# Patient Record
Sex: Male | Born: 1950 | Race: Black or African American | Hispanic: No | Smoking: Current every day smoker
Health system: Southern US, Community
[De-identification: ages and names within clinical notes are randomized; demographics above are authoritative.]

## PROBLEM LIST (undated history)

## (undated) DIAGNOSIS — F17200 Nicotine dependence, unspecified, uncomplicated: Secondary | ICD-10-CM

## (undated) DIAGNOSIS — S8990XA Unspecified injury of unspecified lower leg, initial encounter: Secondary | ICD-10-CM

## (undated) DIAGNOSIS — C189 Malignant neoplasm of colon, unspecified: Secondary | ICD-10-CM

## (undated) DIAGNOSIS — I2699 Other pulmonary embolism without acute cor pulmonale: Secondary | ICD-10-CM

## (undated) DIAGNOSIS — D509 Iron deficiency anemia, unspecified: Secondary | ICD-10-CM

## (undated) DIAGNOSIS — K219 Gastro-esophageal reflux disease without esophagitis: Secondary | ICD-10-CM

## (undated) HISTORY — PX: FRACTURE SURGERY: SHX138

## (undated) HISTORY — DX: Unspecified injury of unspecified lower leg, initial encounter: S89.90XA

---

## 2016-12-20 DIAGNOSIS — Y33XXXA Other specified events, undetermined intent, initial encounter: Secondary | ICD-10-CM | POA: Diagnosis not present

## 2016-12-20 DIAGNOSIS — S82891C Other fracture of right lower leg, initial encounter for open fracture type IIIA, IIIB, or IIIC: Secondary | ICD-10-CM | POA: Diagnosis not present

## 2016-12-20 DIAGNOSIS — Z043 Encounter for examination and observation following other accident: Secondary | ICD-10-CM | POA: Diagnosis not present

## 2016-12-20 DIAGNOSIS — M25571 Pain in right ankle and joints of right foot: Secondary | ICD-10-CM | POA: Diagnosis not present

## 2016-12-20 DIAGNOSIS — S3991XA Unspecified injury of abdomen, initial encounter: Secondary | ICD-10-CM | POA: Diagnosis not present

## 2016-12-20 DIAGNOSIS — S299XXA Unspecified injury of thorax, initial encounter: Secondary | ICD-10-CM | POA: Diagnosis not present

## 2016-12-20 DIAGNOSIS — S9304XA Dislocation of right ankle joint, initial encounter: Secondary | ICD-10-CM | POA: Diagnosis not present

## 2016-12-20 DIAGNOSIS — S8251XA Displaced fracture of medial malleolus of right tibia, initial encounter for closed fracture: Secondary | ICD-10-CM | POA: Diagnosis not present

## 2016-12-20 DIAGNOSIS — R911 Solitary pulmonary nodule: Secondary | ICD-10-CM | POA: Diagnosis not present

## 2016-12-20 DIAGNOSIS — S199XXA Unspecified injury of neck, initial encounter: Secondary | ICD-10-CM | POA: Diagnosis not present

## 2016-12-20 DIAGNOSIS — M4802 Spinal stenosis, cervical region: Secondary | ICD-10-CM | POA: Diagnosis not present

## 2016-12-20 DIAGNOSIS — M50222 Other cervical disc displacement at C5-C6 level: Secondary | ICD-10-CM | POA: Diagnosis not present

## 2016-12-20 DIAGNOSIS — S82851C Displaced trimalleolar fracture of right lower leg, initial encounter for open fracture type IIIA, IIIB, or IIIC: Secondary | ICD-10-CM | POA: Diagnosis not present

## 2016-12-20 DIAGNOSIS — S3992XA Unspecified injury of lower back, initial encounter: Secondary | ICD-10-CM | POA: Diagnosis not present

## 2016-12-20 DIAGNOSIS — S3993XA Unspecified injury of pelvis, initial encounter: Secondary | ICD-10-CM | POA: Diagnosis not present

## 2016-12-20 DIAGNOSIS — S8251XB Displaced fracture of medial malleolus of right tibia, initial encounter for open fracture type I or II: Secondary | ICD-10-CM | POA: Diagnosis not present

## 2016-12-20 DIAGNOSIS — S0990XA Unspecified injury of head, initial encounter: Secondary | ICD-10-CM | POA: Diagnosis not present

## 2016-12-20 DIAGNOSIS — S82831A Other fracture of upper and lower end of right fibula, initial encounter for closed fracture: Secondary | ICD-10-CM | POA: Diagnosis not present

## 2016-12-20 DIAGNOSIS — R5381 Other malaise: Secondary | ICD-10-CM | POA: Diagnosis not present

## 2016-12-20 DIAGNOSIS — W19XXXA Unspecified fall, initial encounter: Secondary | ICD-10-CM | POA: Diagnosis not present

## 2016-12-20 DIAGNOSIS — E871 Hypo-osmolality and hyponatremia: Secondary | ICD-10-CM | POA: Diagnosis not present

## 2016-12-20 DIAGNOSIS — F17210 Nicotine dependence, cigarettes, uncomplicated: Secondary | ICD-10-CM | POA: Diagnosis not present

## 2016-12-20 DIAGNOSIS — S99911A Unspecified injury of right ankle, initial encounter: Secondary | ICD-10-CM | POA: Diagnosis not present

## 2016-12-20 DIAGNOSIS — W11XXXA Fall on and from ladder, initial encounter: Secondary | ICD-10-CM | POA: Diagnosis not present

## 2016-12-20 DIAGNOSIS — M8580 Other specified disorders of bone density and structure, unspecified site: Secondary | ICD-10-CM | POA: Diagnosis not present

## 2016-12-21 DIAGNOSIS — E871 Hypo-osmolality and hyponatremia: Secondary | ICD-10-CM | POA: Diagnosis not present

## 2016-12-21 DIAGNOSIS — W11XXXA Fall on and from ladder, initial encounter: Secondary | ICD-10-CM | POA: Diagnosis not present

## 2016-12-21 DIAGNOSIS — R52 Pain, unspecified: Secondary | ICD-10-CM | POA: Diagnosis not present

## 2016-12-21 DIAGNOSIS — W19XXXA Unspecified fall, initial encounter: Secondary | ICD-10-CM | POA: Diagnosis not present

## 2016-12-21 DIAGNOSIS — S82851C Displaced trimalleolar fracture of right lower leg, initial encounter for open fracture type IIIA, IIIB, or IIIC: Secondary | ICD-10-CM | POA: Diagnosis present

## 2016-12-21 DIAGNOSIS — I517 Cardiomegaly: Secondary | ICD-10-CM | POA: Diagnosis not present

## 2016-12-21 DIAGNOSIS — R531 Weakness: Secondary | ICD-10-CM | POA: Diagnosis not present

## 2016-12-21 DIAGNOSIS — S82841A Displaced bimalleolar fracture of right lower leg, initial encounter for closed fracture: Secondary | ICD-10-CM | POA: Diagnosis not present

## 2016-12-21 DIAGNOSIS — K5900 Constipation, unspecified: Secondary | ICD-10-CM | POA: Diagnosis not present

## 2016-12-21 DIAGNOSIS — Z5189 Encounter for other specified aftercare: Secondary | ICD-10-CM | POA: Diagnosis not present

## 2016-12-21 DIAGNOSIS — S8291XD Unspecified fracture of right lower leg, subsequent encounter for closed fracture with routine healing: Secondary | ICD-10-CM | POA: Diagnosis not present

## 2016-12-21 DIAGNOSIS — S82891C Other fracture of right lower leg, initial encounter for open fracture type IIIA, IIIB, or IIIC: Secondary | ICD-10-CM | POA: Diagnosis not present

## 2016-12-21 DIAGNOSIS — R262 Difficulty in walking, not elsewhere classified: Secondary | ICD-10-CM | POA: Diagnosis not present

## 2016-12-21 DIAGNOSIS — F17210 Nicotine dependence, cigarettes, uncomplicated: Secondary | ICD-10-CM | POA: Diagnosis not present

## 2016-12-21 DIAGNOSIS — Z23 Encounter for immunization: Secondary | ICD-10-CM | POA: Diagnosis not present

## 2016-12-21 DIAGNOSIS — Z7401 Bed confinement status: Secondary | ICD-10-CM | POA: Diagnosis not present

## 2016-12-21 DIAGNOSIS — M6281 Muscle weakness (generalized): Secondary | ICD-10-CM | POA: Diagnosis not present

## 2016-12-21 DIAGNOSIS — S82451A Displaced comminuted fracture of shaft of right fibula, initial encounter for closed fracture: Secondary | ICD-10-CM | POA: Diagnosis not present

## 2016-12-26 DIAGNOSIS — S82891F Other fracture of right lower leg, subsequent encounter for open fracture type IIIA, IIIB, or IIIC with routine healing: Secondary | ICD-10-CM | POA: Diagnosis not present

## 2016-12-26 DIAGNOSIS — R262 Difficulty in walking, not elsewhere classified: Secondary | ICD-10-CM | POA: Diagnosis not present

## 2016-12-26 DIAGNOSIS — Y33XXXD Other specified events, undetermined intent, subsequent encounter: Secondary | ICD-10-CM | POA: Diagnosis not present

## 2016-12-26 DIAGNOSIS — Z7982 Long term (current) use of aspirin: Secondary | ICD-10-CM | POA: Diagnosis not present

## 2016-12-26 DIAGNOSIS — F17210 Nicotine dependence, cigarettes, uncomplicated: Secondary | ICD-10-CM | POA: Diagnosis not present

## 2016-12-26 DIAGNOSIS — D649 Anemia, unspecified: Secondary | ICD-10-CM | POA: Diagnosis not present

## 2016-12-26 DIAGNOSIS — K5900 Constipation, unspecified: Secondary | ICD-10-CM | POA: Diagnosis not present

## 2016-12-26 DIAGNOSIS — R531 Weakness: Secondary | ICD-10-CM | POA: Diagnosis not present

## 2016-12-26 DIAGNOSIS — F17200 Nicotine dependence, unspecified, uncomplicated: Secondary | ICD-10-CM | POA: Diagnosis not present

## 2016-12-26 DIAGNOSIS — Z791 Long term (current) use of non-steroidal anti-inflammatories (NSAID): Secondary | ICD-10-CM | POA: Diagnosis not present

## 2016-12-26 DIAGNOSIS — S82841D Displaced bimalleolar fracture of right lower leg, subsequent encounter for closed fracture with routine healing: Secondary | ICD-10-CM | POA: Diagnosis not present

## 2016-12-26 DIAGNOSIS — Z23 Encounter for immunization: Secondary | ICD-10-CM | POA: Diagnosis not present

## 2016-12-26 DIAGNOSIS — S82851C Displaced trimalleolar fracture of right lower leg, initial encounter for open fracture type IIIA, IIIB, or IIIC: Secondary | ICD-10-CM | POA: Diagnosis not present

## 2016-12-26 DIAGNOSIS — S82851D Displaced trimalleolar fracture of right lower leg, subsequent encounter for closed fracture with routine healing: Secondary | ICD-10-CM | POA: Diagnosis not present

## 2016-12-26 DIAGNOSIS — R52 Pain, unspecified: Secondary | ICD-10-CM | POA: Diagnosis not present

## 2016-12-26 DIAGNOSIS — Z5189 Encounter for other specified aftercare: Secondary | ICD-10-CM | POA: Diagnosis not present

## 2016-12-26 DIAGNOSIS — Z7401 Bed confinement status: Secondary | ICD-10-CM | POA: Diagnosis not present

## 2016-12-26 DIAGNOSIS — S8291XD Unspecified fracture of right lower leg, subsequent encounter for closed fracture with routine healing: Secondary | ICD-10-CM | POA: Diagnosis not present

## 2016-12-26 DIAGNOSIS — M6281 Muscle weakness (generalized): Secondary | ICD-10-CM | POA: Diagnosis not present

## 2016-12-26 DIAGNOSIS — E871 Hypo-osmolality and hyponatremia: Secondary | ICD-10-CM | POA: Diagnosis not present

## 2016-12-30 DIAGNOSIS — M6281 Muscle weakness (generalized): Secondary | ICD-10-CM | POA: Diagnosis not present

## 2016-12-30 DIAGNOSIS — Z791 Long term (current) use of non-steroidal anti-inflammatories (NSAID): Secondary | ICD-10-CM | POA: Diagnosis not present

## 2016-12-30 DIAGNOSIS — S82851D Displaced trimalleolar fracture of right lower leg, subsequent encounter for closed fracture with routine healing: Secondary | ICD-10-CM | POA: Diagnosis not present

## 2017-01-01 DIAGNOSIS — S82841D Displaced bimalleolar fracture of right lower leg, subsequent encounter for closed fracture with routine healing: Secondary | ICD-10-CM | POA: Diagnosis not present

## 2017-01-01 DIAGNOSIS — F17200 Nicotine dependence, unspecified, uncomplicated: Secondary | ICD-10-CM | POA: Diagnosis not present

## 2017-01-01 DIAGNOSIS — S82891F Other fracture of right lower leg, subsequent encounter for open fracture type IIIA, IIIB, or IIIC with routine healing: Secondary | ICD-10-CM | POA: Diagnosis not present

## 2017-01-01 DIAGNOSIS — S82851D Displaced trimalleolar fracture of right lower leg, subsequent encounter for closed fracture with routine healing: Secondary | ICD-10-CM | POA: Diagnosis not present

## 2017-01-01 DIAGNOSIS — Y33XXXD Other specified events, undetermined intent, subsequent encounter: Secondary | ICD-10-CM | POA: Diagnosis not present

## 2017-01-08 DIAGNOSIS — F17210 Nicotine dependence, cigarettes, uncomplicated: Secondary | ICD-10-CM | POA: Diagnosis not present

## 2017-01-08 DIAGNOSIS — S82851D Displaced trimalleolar fracture of right lower leg, subsequent encounter for closed fracture with routine healing: Secondary | ICD-10-CM | POA: Diagnosis not present

## 2017-01-08 DIAGNOSIS — D649 Anemia, unspecified: Secondary | ICD-10-CM | POA: Diagnosis not present

## 2017-01-08 DIAGNOSIS — Z7982 Long term (current) use of aspirin: Secondary | ICD-10-CM | POA: Diagnosis not present

## 2017-01-11 DIAGNOSIS — S82851E Displaced trimalleolar fracture of right lower leg, subsequent encounter for open fracture type I or II with routine healing: Secondary | ICD-10-CM | POA: Diagnosis not present

## 2017-01-11 DIAGNOSIS — Z9181 History of falling: Secondary | ICD-10-CM | POA: Diagnosis not present

## 2017-01-11 DIAGNOSIS — K5900 Constipation, unspecified: Secondary | ICD-10-CM | POA: Diagnosis not present

## 2017-01-11 DIAGNOSIS — F17210 Nicotine dependence, cigarettes, uncomplicated: Secondary | ICD-10-CM | POA: Diagnosis not present

## 2017-01-14 DIAGNOSIS — K5900 Constipation, unspecified: Secondary | ICD-10-CM | POA: Diagnosis not present

## 2017-01-14 DIAGNOSIS — Z9181 History of falling: Secondary | ICD-10-CM | POA: Diagnosis not present

## 2017-01-14 DIAGNOSIS — S82851E Displaced trimalleolar fracture of right lower leg, subsequent encounter for open fracture type I or II with routine healing: Secondary | ICD-10-CM | POA: Diagnosis not present

## 2017-01-14 DIAGNOSIS — F17210 Nicotine dependence, cigarettes, uncomplicated: Secondary | ICD-10-CM | POA: Diagnosis not present

## 2017-01-15 DIAGNOSIS — S8253XA Displaced fracture of medial malleolus of unspecified tibia, initial encounter for closed fracture: Secondary | ICD-10-CM | POA: Diagnosis not present

## 2017-01-15 DIAGNOSIS — Y33XXXA Other specified events, undetermined intent, initial encounter: Secondary | ICD-10-CM | POA: Diagnosis not present

## 2017-01-15 DIAGNOSIS — S82851D Displaced trimalleolar fracture of right lower leg, subsequent encounter for closed fracture with routine healing: Secondary | ICD-10-CM | POA: Diagnosis not present

## 2017-01-16 DIAGNOSIS — Z9181 History of falling: Secondary | ICD-10-CM | POA: Diagnosis not present

## 2017-01-16 DIAGNOSIS — F17210 Nicotine dependence, cigarettes, uncomplicated: Secondary | ICD-10-CM | POA: Diagnosis not present

## 2017-01-16 DIAGNOSIS — S82851E Displaced trimalleolar fracture of right lower leg, subsequent encounter for open fracture type I or II with routine healing: Secondary | ICD-10-CM | POA: Diagnosis not present

## 2017-01-16 DIAGNOSIS — K5900 Constipation, unspecified: Secondary | ICD-10-CM | POA: Diagnosis not present

## 2017-01-18 DIAGNOSIS — F17210 Nicotine dependence, cigarettes, uncomplicated: Secondary | ICD-10-CM | POA: Diagnosis not present

## 2017-01-18 DIAGNOSIS — K5900 Constipation, unspecified: Secondary | ICD-10-CM | POA: Diagnosis not present

## 2017-01-18 DIAGNOSIS — S82851E Displaced trimalleolar fracture of right lower leg, subsequent encounter for open fracture type I or II with routine healing: Secondary | ICD-10-CM | POA: Diagnosis not present

## 2017-01-18 DIAGNOSIS — Z9181 History of falling: Secondary | ICD-10-CM | POA: Diagnosis not present

## 2017-01-23 DIAGNOSIS — M869 Osteomyelitis, unspecified: Secondary | ICD-10-CM | POA: Diagnosis not present

## 2017-01-23 DIAGNOSIS — Z452 Encounter for adjustment and management of vascular access device: Secondary | ICD-10-CM | POA: Diagnosis not present

## 2017-01-23 DIAGNOSIS — G8918 Other acute postprocedural pain: Secondary | ICD-10-CM | POA: Diagnosis not present

## 2017-01-23 DIAGNOSIS — S82841D Displaced bimalleolar fracture of right lower leg, subsequent encounter for closed fracture with routine healing: Secondary | ICD-10-CM | POA: Diagnosis not present

## 2017-01-23 DIAGNOSIS — S82851C Displaced trimalleolar fracture of right lower leg, initial encounter for open fracture type IIIA, IIIB, or IIIC: Secondary | ICD-10-CM | POA: Diagnosis not present

## 2017-01-23 DIAGNOSIS — T847XXA Infection and inflammatory reaction due to other internal orthopedic prosthetic devices, implants and grafts, initial encounter: Secondary | ICD-10-CM | POA: Diagnosis not present

## 2017-01-23 DIAGNOSIS — F17290 Nicotine dependence, other tobacco product, uncomplicated: Secondary | ICD-10-CM | POA: Diagnosis present

## 2017-01-23 DIAGNOSIS — A4901 Methicillin susceptible Staphylococcus aureus infection, unspecified site: Secondary | ICD-10-CM | POA: Diagnosis not present

## 2017-01-23 DIAGNOSIS — S82851F Displaced trimalleolar fracture of right lower leg, subsequent encounter for open fracture type IIIA, IIIB, or IIIC with routine healing: Secondary | ICD-10-CM | POA: Diagnosis not present

## 2017-01-23 DIAGNOSIS — T847XXD Infection and inflammatory reaction due to other internal orthopedic prosthetic devices, implants and grafts, subsequent encounter: Secondary | ICD-10-CM | POA: Diagnosis not present

## 2017-01-23 DIAGNOSIS — Y33XXXA Other specified events, undetermined intent, initial encounter: Secondary | ICD-10-CM | POA: Diagnosis not present

## 2017-01-23 DIAGNOSIS — Y33XXXD Other specified events, undetermined intent, subsequent encounter: Secondary | ICD-10-CM | POA: Diagnosis not present

## 2017-01-23 DIAGNOSIS — F17210 Nicotine dependence, cigarettes, uncomplicated: Secondary | ICD-10-CM | POA: Diagnosis present

## 2017-01-23 DIAGNOSIS — B964 Proteus (mirabilis) (morganii) as the cause of diseases classified elsewhere: Secondary | ICD-10-CM | POA: Diagnosis present

## 2017-01-23 DIAGNOSIS — T8141XA Infection following a procedure, superficial incisional surgical site, initial encounter: Secondary | ICD-10-CM | POA: Diagnosis present

## 2017-01-23 DIAGNOSIS — A498 Other bacterial infections of unspecified site: Secondary | ICD-10-CM | POA: Diagnosis not present

## 2017-01-31 DIAGNOSIS — K5900 Constipation, unspecified: Secondary | ICD-10-CM | POA: Diagnosis not present

## 2017-01-31 DIAGNOSIS — Z9181 History of falling: Secondary | ICD-10-CM | POA: Diagnosis not present

## 2017-01-31 DIAGNOSIS — S82851E Displaced trimalleolar fracture of right lower leg, subsequent encounter for open fracture type I or II with routine healing: Secondary | ICD-10-CM | POA: Diagnosis not present

## 2017-01-31 DIAGNOSIS — F17210 Nicotine dependence, cigarettes, uncomplicated: Secondary | ICD-10-CM | POA: Diagnosis not present

## 2017-02-01 DIAGNOSIS — F17210 Nicotine dependence, cigarettes, uncomplicated: Secondary | ICD-10-CM | POA: Diagnosis not present

## 2017-02-01 DIAGNOSIS — Z9181 History of falling: Secondary | ICD-10-CM | POA: Diagnosis not present

## 2017-02-01 DIAGNOSIS — K5900 Constipation, unspecified: Secondary | ICD-10-CM | POA: Diagnosis not present

## 2017-02-01 DIAGNOSIS — S82851E Displaced trimalleolar fracture of right lower leg, subsequent encounter for open fracture type I or II with routine healing: Secondary | ICD-10-CM | POA: Diagnosis not present

## 2017-02-03 DIAGNOSIS — K5900 Constipation, unspecified: Secondary | ICD-10-CM | POA: Diagnosis not present

## 2017-02-03 DIAGNOSIS — F17210 Nicotine dependence, cigarettes, uncomplicated: Secondary | ICD-10-CM | POA: Diagnosis not present

## 2017-02-03 DIAGNOSIS — S82851E Displaced trimalleolar fracture of right lower leg, subsequent encounter for open fracture type I or II with routine healing: Secondary | ICD-10-CM | POA: Diagnosis not present

## 2017-02-03 DIAGNOSIS — Z9181 History of falling: Secondary | ICD-10-CM | POA: Diagnosis not present

## 2017-02-04 DIAGNOSIS — Z9181 History of falling: Secondary | ICD-10-CM | POA: Diagnosis not present

## 2017-02-04 DIAGNOSIS — F17210 Nicotine dependence, cigarettes, uncomplicated: Secondary | ICD-10-CM | POA: Diagnosis not present

## 2017-02-04 DIAGNOSIS — T847XXA Infection and inflammatory reaction due to other internal orthopedic prosthetic devices, implants and grafts, initial encounter: Secondary | ICD-10-CM | POA: Diagnosis not present

## 2017-02-04 DIAGNOSIS — S82851E Displaced trimalleolar fracture of right lower leg, subsequent encounter for open fracture type I or II with routine healing: Secondary | ICD-10-CM | POA: Diagnosis not present

## 2017-02-04 DIAGNOSIS — K5900 Constipation, unspecified: Secondary | ICD-10-CM | POA: Diagnosis not present

## 2017-02-06 DIAGNOSIS — Z9181 History of falling: Secondary | ICD-10-CM | POA: Diagnosis not present

## 2017-02-06 DIAGNOSIS — F17210 Nicotine dependence, cigarettes, uncomplicated: Secondary | ICD-10-CM | POA: Diagnosis not present

## 2017-02-06 DIAGNOSIS — S82851E Displaced trimalleolar fracture of right lower leg, subsequent encounter for open fracture type I or II with routine healing: Secondary | ICD-10-CM | POA: Diagnosis not present

## 2017-02-06 DIAGNOSIS — K5900 Constipation, unspecified: Secondary | ICD-10-CM | POA: Diagnosis not present

## 2017-02-07 DIAGNOSIS — Z9181 History of falling: Secondary | ICD-10-CM | POA: Diagnosis not present

## 2017-02-07 DIAGNOSIS — K5900 Constipation, unspecified: Secondary | ICD-10-CM | POA: Diagnosis not present

## 2017-02-07 DIAGNOSIS — S82851E Displaced trimalleolar fracture of right lower leg, subsequent encounter for open fracture type I or II with routine healing: Secondary | ICD-10-CM | POA: Diagnosis not present

## 2017-02-07 DIAGNOSIS — F17210 Nicotine dependence, cigarettes, uncomplicated: Secondary | ICD-10-CM | POA: Diagnosis not present

## 2017-02-10 DIAGNOSIS — F17210 Nicotine dependence, cigarettes, uncomplicated: Secondary | ICD-10-CM | POA: Diagnosis not present

## 2017-02-10 DIAGNOSIS — K5900 Constipation, unspecified: Secondary | ICD-10-CM | POA: Diagnosis not present

## 2017-02-10 DIAGNOSIS — S82851E Displaced trimalleolar fracture of right lower leg, subsequent encounter for open fracture type I or II with routine healing: Secondary | ICD-10-CM | POA: Diagnosis not present

## 2017-02-10 DIAGNOSIS — Z9181 History of falling: Secondary | ICD-10-CM | POA: Diagnosis not present

## 2017-02-11 DIAGNOSIS — S82851E Displaced trimalleolar fracture of right lower leg, subsequent encounter for open fracture type I or II with routine healing: Secondary | ICD-10-CM | POA: Diagnosis not present

## 2017-02-11 DIAGNOSIS — F17210 Nicotine dependence, cigarettes, uncomplicated: Secondary | ICD-10-CM | POA: Diagnosis not present

## 2017-02-11 DIAGNOSIS — K5900 Constipation, unspecified: Secondary | ICD-10-CM | POA: Diagnosis not present

## 2017-02-11 DIAGNOSIS — Z9181 History of falling: Secondary | ICD-10-CM | POA: Diagnosis not present

## 2017-02-11 DIAGNOSIS — T847XXA Infection and inflammatory reaction due to other internal orthopedic prosthetic devices, implants and grafts, initial encounter: Secondary | ICD-10-CM | POA: Diagnosis not present

## 2017-02-12 DIAGNOSIS — M85871 Other specified disorders of bone density and structure, right ankle and foot: Secondary | ICD-10-CM | POA: Diagnosis not present

## 2017-02-12 DIAGNOSIS — M8580 Other specified disorders of bone density and structure, unspecified site: Secondary | ICD-10-CM | POA: Diagnosis not present

## 2017-02-12 DIAGNOSIS — S82851A Displaced trimalleolar fracture of right lower leg, initial encounter for closed fracture: Secondary | ICD-10-CM | POA: Diagnosis not present

## 2017-02-12 DIAGNOSIS — S82851F Displaced trimalleolar fracture of right lower leg, subsequent encounter for open fracture type IIIA, IIIB, or IIIC with routine healing: Secondary | ICD-10-CM | POA: Diagnosis not present

## 2017-02-12 DIAGNOSIS — Y33XXXA Other specified events, undetermined intent, initial encounter: Secondary | ICD-10-CM | POA: Diagnosis not present

## 2017-02-12 DIAGNOSIS — R2241 Localized swelling, mass and lump, right lower limb: Secondary | ICD-10-CM | POA: Diagnosis not present

## 2017-02-12 DIAGNOSIS — F17210 Nicotine dependence, cigarettes, uncomplicated: Secondary | ICD-10-CM | POA: Diagnosis not present

## 2017-02-12 DIAGNOSIS — K5900 Constipation, unspecified: Secondary | ICD-10-CM | POA: Diagnosis not present

## 2017-02-12 DIAGNOSIS — Z9181 History of falling: Secondary | ICD-10-CM | POA: Diagnosis not present

## 2017-02-12 DIAGNOSIS — S82851E Displaced trimalleolar fracture of right lower leg, subsequent encounter for open fracture type I or II with routine healing: Secondary | ICD-10-CM | POA: Diagnosis not present

## 2017-02-14 DIAGNOSIS — S82851E Displaced trimalleolar fracture of right lower leg, subsequent encounter for open fracture type I or II with routine healing: Secondary | ICD-10-CM | POA: Diagnosis not present

## 2017-02-14 DIAGNOSIS — Z9181 History of falling: Secondary | ICD-10-CM | POA: Diagnosis not present

## 2017-02-14 DIAGNOSIS — K5900 Constipation, unspecified: Secondary | ICD-10-CM | POA: Diagnosis not present

## 2017-02-14 DIAGNOSIS — F17210 Nicotine dependence, cigarettes, uncomplicated: Secondary | ICD-10-CM | POA: Diagnosis not present

## 2017-02-15 DIAGNOSIS — S82851E Displaced trimalleolar fracture of right lower leg, subsequent encounter for open fracture type I or II with routine healing: Secondary | ICD-10-CM | POA: Diagnosis not present

## 2017-02-15 DIAGNOSIS — K5900 Constipation, unspecified: Secondary | ICD-10-CM | POA: Diagnosis not present

## 2017-02-15 DIAGNOSIS — F17210 Nicotine dependence, cigarettes, uncomplicated: Secondary | ICD-10-CM | POA: Diagnosis not present

## 2017-02-15 DIAGNOSIS — Z9181 History of falling: Secondary | ICD-10-CM | POA: Diagnosis not present

## 2017-02-18 DIAGNOSIS — K5900 Constipation, unspecified: Secondary | ICD-10-CM | POA: Diagnosis not present

## 2017-02-18 DIAGNOSIS — S82851E Displaced trimalleolar fracture of right lower leg, subsequent encounter for open fracture type I or II with routine healing: Secondary | ICD-10-CM | POA: Diagnosis not present

## 2017-02-18 DIAGNOSIS — Z9181 History of falling: Secondary | ICD-10-CM | POA: Diagnosis not present

## 2017-02-18 DIAGNOSIS — F17210 Nicotine dependence, cigarettes, uncomplicated: Secondary | ICD-10-CM | POA: Diagnosis not present

## 2017-02-19 DIAGNOSIS — K5900 Constipation, unspecified: Secondary | ICD-10-CM | POA: Diagnosis not present

## 2017-02-19 DIAGNOSIS — Z9181 History of falling: Secondary | ICD-10-CM | POA: Diagnosis not present

## 2017-02-19 DIAGNOSIS — T847XXA Infection and inflammatory reaction due to other internal orthopedic prosthetic devices, implants and grafts, initial encounter: Secondary | ICD-10-CM | POA: Diagnosis not present

## 2017-02-19 DIAGNOSIS — F17210 Nicotine dependence, cigarettes, uncomplicated: Secondary | ICD-10-CM | POA: Diagnosis not present

## 2017-02-19 DIAGNOSIS — S82851E Displaced trimalleolar fracture of right lower leg, subsequent encounter for open fracture type I or II with routine healing: Secondary | ICD-10-CM | POA: Diagnosis not present

## 2017-02-20 DIAGNOSIS — K5900 Constipation, unspecified: Secondary | ICD-10-CM | POA: Diagnosis not present

## 2017-02-20 DIAGNOSIS — S82851E Displaced trimalleolar fracture of right lower leg, subsequent encounter for open fracture type I or II with routine healing: Secondary | ICD-10-CM | POA: Diagnosis not present

## 2017-02-20 DIAGNOSIS — F17210 Nicotine dependence, cigarettes, uncomplicated: Secondary | ICD-10-CM | POA: Diagnosis not present

## 2017-02-20 DIAGNOSIS — Z9181 History of falling: Secondary | ICD-10-CM | POA: Diagnosis not present

## 2017-02-21 DIAGNOSIS — Z9181 History of falling: Secondary | ICD-10-CM | POA: Diagnosis not present

## 2017-02-21 DIAGNOSIS — F17210 Nicotine dependence, cigarettes, uncomplicated: Secondary | ICD-10-CM | POA: Diagnosis not present

## 2017-02-21 DIAGNOSIS — K5900 Constipation, unspecified: Secondary | ICD-10-CM | POA: Diagnosis not present

## 2017-02-21 DIAGNOSIS — S82851E Displaced trimalleolar fracture of right lower leg, subsequent encounter for open fracture type I or II with routine healing: Secondary | ICD-10-CM | POA: Diagnosis not present

## 2017-02-23 DIAGNOSIS — K5900 Constipation, unspecified: Secondary | ICD-10-CM | POA: Diagnosis not present

## 2017-02-23 DIAGNOSIS — S82851E Displaced trimalleolar fracture of right lower leg, subsequent encounter for open fracture type I or II with routine healing: Secondary | ICD-10-CM | POA: Diagnosis not present

## 2017-02-23 DIAGNOSIS — T8469XA Infection and inflammatory reaction due to internal fixation device of other site, initial encounter: Secondary | ICD-10-CM | POA: Diagnosis not present

## 2017-02-23 DIAGNOSIS — Z9181 History of falling: Secondary | ICD-10-CM | POA: Diagnosis not present

## 2017-02-23 DIAGNOSIS — B9561 Methicillin susceptible Staphylococcus aureus infection as the cause of diseases classified elsewhere: Secondary | ICD-10-CM | POA: Diagnosis not present

## 2017-02-23 DIAGNOSIS — F17210 Nicotine dependence, cigarettes, uncomplicated: Secondary | ICD-10-CM | POA: Diagnosis not present

## 2017-02-26 DIAGNOSIS — Z9181 History of falling: Secondary | ICD-10-CM | POA: Diagnosis not present

## 2017-02-26 DIAGNOSIS — K5900 Constipation, unspecified: Secondary | ICD-10-CM | POA: Diagnosis not present

## 2017-02-26 DIAGNOSIS — B9561 Methicillin susceptible Staphylococcus aureus infection as the cause of diseases classified elsewhere: Secondary | ICD-10-CM | POA: Diagnosis not present

## 2017-02-26 DIAGNOSIS — S82851E Displaced trimalleolar fracture of right lower leg, subsequent encounter for open fracture type I or II with routine healing: Secondary | ICD-10-CM | POA: Diagnosis not present

## 2017-02-26 DIAGNOSIS — T8469XA Infection and inflammatory reaction due to internal fixation device of other site, initial encounter: Secondary | ICD-10-CM | POA: Diagnosis not present

## 2017-02-26 DIAGNOSIS — F17210 Nicotine dependence, cigarettes, uncomplicated: Secondary | ICD-10-CM | POA: Diagnosis not present

## 2017-02-28 DIAGNOSIS — K5900 Constipation, unspecified: Secondary | ICD-10-CM | POA: Diagnosis not present

## 2017-02-28 DIAGNOSIS — F17210 Nicotine dependence, cigarettes, uncomplicated: Secondary | ICD-10-CM | POA: Diagnosis not present

## 2017-02-28 DIAGNOSIS — Z9181 History of falling: Secondary | ICD-10-CM | POA: Diagnosis not present

## 2017-02-28 DIAGNOSIS — S82851E Displaced trimalleolar fracture of right lower leg, subsequent encounter for open fracture type I or II with routine healing: Secondary | ICD-10-CM | POA: Diagnosis not present

## 2017-03-04 DIAGNOSIS — Z9181 History of falling: Secondary | ICD-10-CM | POA: Diagnosis not present

## 2017-03-04 DIAGNOSIS — S82851E Displaced trimalleolar fracture of right lower leg, subsequent encounter for open fracture type I or II with routine healing: Secondary | ICD-10-CM | POA: Diagnosis not present

## 2017-03-04 DIAGNOSIS — F17210 Nicotine dependence, cigarettes, uncomplicated: Secondary | ICD-10-CM | POA: Diagnosis not present

## 2017-03-04 DIAGNOSIS — K5900 Constipation, unspecified: Secondary | ICD-10-CM | POA: Diagnosis not present

## 2017-03-06 DIAGNOSIS — Z9181 History of falling: Secondary | ICD-10-CM | POA: Diagnosis not present

## 2017-03-06 DIAGNOSIS — S82851E Displaced trimalleolar fracture of right lower leg, subsequent encounter for open fracture type I or II with routine healing: Secondary | ICD-10-CM | POA: Diagnosis not present

## 2017-03-06 DIAGNOSIS — F17210 Nicotine dependence, cigarettes, uncomplicated: Secondary | ICD-10-CM | POA: Diagnosis not present

## 2017-03-06 DIAGNOSIS — K5900 Constipation, unspecified: Secondary | ICD-10-CM | POA: Diagnosis not present

## 2017-03-07 DIAGNOSIS — I82621 Acute embolism and thrombosis of deep veins of right upper extremity: Secondary | ICD-10-CM | POA: Diagnosis not present

## 2017-03-07 DIAGNOSIS — F17210 Nicotine dependence, cigarettes, uncomplicated: Secondary | ICD-10-CM | POA: Diagnosis not present

## 2017-03-07 DIAGNOSIS — K5900 Constipation, unspecified: Secondary | ICD-10-CM | POA: Diagnosis not present

## 2017-03-07 DIAGNOSIS — T82598A Other mechanical complication of other cardiac and vascular devices and implants, initial encounter: Secondary | ICD-10-CM | POA: Diagnosis not present

## 2017-03-07 DIAGNOSIS — Z5181 Encounter for therapeutic drug level monitoring: Secondary | ICD-10-CM | POA: Diagnosis not present

## 2017-03-07 DIAGNOSIS — M7989 Other specified soft tissue disorders: Secondary | ICD-10-CM | POA: Diagnosis not present

## 2017-03-07 DIAGNOSIS — Z79899 Other long term (current) drug therapy: Secondary | ICD-10-CM | POA: Diagnosis not present

## 2017-03-07 DIAGNOSIS — S82851E Displaced trimalleolar fracture of right lower leg, subsequent encounter for open fracture type I or II with routine healing: Secondary | ICD-10-CM | POA: Diagnosis not present

## 2017-03-07 DIAGNOSIS — Z452 Encounter for adjustment and management of vascular access device: Secondary | ICD-10-CM | POA: Diagnosis not present

## 2017-03-07 DIAGNOSIS — I82B11 Acute embolism and thrombosis of right subclavian vein: Secondary | ICD-10-CM | POA: Diagnosis not present

## 2017-03-07 DIAGNOSIS — I82611 Acute embolism and thrombosis of superficial veins of right upper extremity: Secondary | ICD-10-CM | POA: Diagnosis not present

## 2017-03-07 DIAGNOSIS — I82A11 Acute embolism and thrombosis of right axillary vein: Secondary | ICD-10-CM | POA: Diagnosis not present

## 2017-03-07 DIAGNOSIS — Z9181 History of falling: Secondary | ICD-10-CM | POA: Diagnosis not present

## 2017-03-26 DIAGNOSIS — S82851D Displaced trimalleolar fracture of right lower leg, subsequent encounter for closed fracture with routine healing: Secondary | ICD-10-CM | POA: Diagnosis not present

## 2017-03-26 DIAGNOSIS — F17210 Nicotine dependence, cigarettes, uncomplicated: Secondary | ICD-10-CM | POA: Diagnosis not present

## 2017-03-26 DIAGNOSIS — Y33XXXD Other specified events, undetermined intent, subsequent encounter: Secondary | ICD-10-CM | POA: Diagnosis not present

## 2017-04-11 DIAGNOSIS — R937 Abnormal findings on diagnostic imaging of other parts of musculoskeletal system: Secondary | ICD-10-CM | POA: Diagnosis not present

## 2017-04-11 DIAGNOSIS — I82621 Acute embolism and thrombosis of deep veins of right upper extremity: Secondary | ICD-10-CM | POA: Diagnosis not present

## 2017-12-17 DIAGNOSIS — S8990XA Unspecified injury of unspecified lower leg, initial encounter: Secondary | ICD-10-CM

## 2017-12-17 HISTORY — DX: Unspecified injury of unspecified lower leg, initial encounter: S89.90XA

## 2018-02-05 ENCOUNTER — Other Ambulatory Visit: Payer: Self-pay

## 2018-02-05 ENCOUNTER — Encounter: Payer: Self-pay | Admitting: Emergency Medicine

## 2018-02-05 ENCOUNTER — Ambulatory Visit
Admission: EM | Admit: 2018-02-05 | Discharge: 2018-02-05 | Disposition: A | Discharge status: Home / Self Care | Payer: Medicare Other | Attending: Family Medicine | Admitting: Family Medicine

## 2018-02-05 ENCOUNTER — Ambulatory Visit: Payer: Medicare Other

## 2018-02-05 DIAGNOSIS — W109XXA Fall (on) (from) unspecified stairs and steps, initial encounter: Secondary | ICD-10-CM | POA: Diagnosis not present

## 2018-02-05 DIAGNOSIS — S72441A Displaced fracture of lower epiphysis (separation) of right femur, initial encounter for closed fracture: Secondary | ICD-10-CM | POA: Diagnosis not present

## 2018-02-05 DIAGNOSIS — F17210 Nicotine dependence, cigarettes, uncomplicated: Secondary | ICD-10-CM | POA: Insufficient documentation

## 2018-02-05 DIAGNOSIS — M19171 Post-traumatic osteoarthritis, right ankle and foot: Secondary | ICD-10-CM | POA: Diagnosis not present

## 2018-02-05 DIAGNOSIS — S72421A Displaced fracture of lateral condyle of right femur, initial encounter for closed fracture: Secondary | ICD-10-CM | POA: Diagnosis not present

## 2018-02-05 DIAGNOSIS — S99921A Unspecified injury of right foot, initial encounter: Secondary | ICD-10-CM | POA: Diagnosis not present

## 2018-02-05 DIAGNOSIS — S79191A Other physeal fracture of lower end of right femur, initial encounter for closed fracture: Secondary | ICD-10-CM | POA: Diagnosis not present

## 2018-02-05 DIAGNOSIS — M25461 Effusion, right knee: Secondary | ICD-10-CM | POA: Diagnosis present

## 2018-02-05 DIAGNOSIS — M7989 Other specified soft tissue disorders: Secondary | ICD-10-CM | POA: Diagnosis not present

## 2018-02-05 DIAGNOSIS — W19XXXA Unspecified fall, initial encounter: Secondary | ICD-10-CM | POA: Diagnosis not present

## 2018-02-05 DIAGNOSIS — Z8249 Family history of ischemic heart disease and other diseases of the circulatory system: Secondary | ICD-10-CM | POA: Insufficient documentation

## 2018-02-05 DIAGNOSIS — S99911A Unspecified injury of right ankle, initial encounter: Secondary | ICD-10-CM | POA: Diagnosis not present

## 2018-02-05 MED ORDER — HYDROCODONE-ACETAMINOPHEN 5-325 MG PO TABS: 1.0000 | ORAL_TABLET | Freq: Four times a day (QID) | ORAL | 0 refills | Status: DC | PRN

## 2018-02-05 NOTE — Discharge Instructions (Signed)
Elevate leg Follow up with Dr. Odis LusterBowers (Orthopedist at [facility] in [place]) tomorrow

## 2018-02-05 NOTE — ED Provider Notes (Signed)
[facility]    CSN: [id number] Arrival date & time: 02/05/18  1253     History   Chief Complaint Chief Complaint  Patient presents with  . Knee Injury    DOI 01/20/18    HPI Shane [name] is a 67 y.o. male.   67 yo male with a c/o right knee and lower leg swelling after falling at home 2 weeks ago. States this is his first visit because he thought "it would get better on its own". Complains of mild to moderate pain to the knee. Patient has been using a walker to get around. States he also has a h/o an ankle fracture to the same leg one year ago which required [facility].   The history is provided by the patient.    History reviewed. No pertinent past medical history.  There are no active problems to display for this patient.   Past Surgical History:  Procedure Laterality Date  . FRACTURE SURGERY Right    foot       Home Medications    Prior to Admission medications   Medication Sig Start Date End Date Taking? Authorizing Provider  HYDROcodone-acetaminophen (NORCO/VICODIN) 5-325 MG tablet Take 1-2 tablets by mouth every 6 (six) hours as needed. 02/05/18   Payton Mccallumonty, Johnita Palleschi, MD    Family History Family History  Problem Relation Age of Onset  . Diabetes Mother   . Hypertension Mother   . Hypertension Father     Social History Social History   Tobacco Use  . Smoking status: Current Every Day Smoker    Packs/day: 1.00    Years: 40.00    Pack years: 40.00    Types: Cigarettes  . Smokeless tobacco: Never Used  Substance Use Topics  . Alcohol use: Yes    Alcohol/week: 12.0 standard drinks    Types: 12 Cans of beer per week  . Drug use: Never     Allergies   Patient has no known allergies.   Review of Systems Review of Systems   Physical Exam Triage Vital Signs ED Triage Vitals  Enc Vitals Group     BP 02/05/18 1312 (!) 146/89     Pulse Rate 02/05/18 1312 74     Resp 02/05/18 1312 16     Temp 02/05/18 1312 98.3 F (36.8 C)      Temp Source 02/05/18 1312 Oral     SpO2 02/05/18 1312 99 %     Weight 02/05/18 1313 190 lb (86.2 kg)     Height 02/05/18 1313 6\' 5"  (1.956 m)     Head Circumference --      Peak Flow --      Pain Score 02/05/18 1312 0     Pain Loc --      Pain Edu? --      Excl. in GC? --    No data found.  Updated Vital Signs BP (!) 146/89 (BP Location: Left Arm)   Pulse 74   Temp 98.3 F (36.8 C) (Oral)   Resp 16   Ht 6\' 5"  (1.956 m)   Wt 86.2 kg   SpO2 99%   BMI 22.53 kg/m   Visual Acuity Right Eye Distance:   Left Eye Distance:   Bilateral Distance:    Right Eye Near:   Left Eye Near:    Bilateral Near:     Physical Exam  Constitutional: He appears well-developed and well-nourished. No distress.  Musculoskeletal:       Right  knee: He exhibits decreased range of motion, swelling (extensive swelling from knee down to foot), effusion, abnormal patellar mobility and bony tenderness. He exhibits no laceration and no erythema. Tenderness (mild; diffuse) found.  Skin: He is not diaphoretic.  Nursing note and vitals reviewed.    UC Treatments / Results  Labs (all labs ordered are listed, but only abnormal results are displayed) Labs Reviewed - No data to display  EKG None  Radiology Dg Knee 2 Views Right  Result Date: 02/05/2018 CLINICAL DATA:  Twisting injury of the knee. Injured 01/20/2018. EXAM: RIGHT KNEE - 1-2 VIEW COMPARISON:  None. FINDINGS: Two oblique views of the right knee are performed. Minimally displaced transverse fracture of the lateral distal femoral metaphysis with extension to the lateral trochlea with mild early callus formation. Generalized osteopenia. No aggressive osseous lesion. Large knee joint effusion. IMPRESSION: Minimally displaced transverse fracture of the lateral distal femoral metaphysis with extension to the lateral trochlea with mild early callus formation. Electronically Signed   By: Elige Ko   On: 02/05/2018 14:14   Dg Ankle Complete  Right  Result Date: 02/05/2018 CLINICAL DATA:  Patient fell on 01/20/2018 injuring the foot. History of old fracture of the right ankle. EXAM: RIGHT ANKLE - COMPLETE 3+ VIEW COMPARISON:  None. FINDINGS: Ghost tracks from prior fixation hardware are noted of the included mid to distal diaphysis of the right tibia. Periosteal thickening and likely remote posttraumatic deformity of the tibiotalar joint as well as distal fibular diaphysis and medial malleolus are noted. No acute fracture lucency is seen. 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 Moderate soft tissue swelling is noted about the malleoli. Slight thickening along the distal Achilles is seen on the lateral view. Small calcaneal enthesophytes are present along the plantar and dorsal aspect. Mild soft tissue swelling extends along the dorsum of the included midfoot. IMPRESSION: 1. Moderate soft tissue swelling about the malleoli. Remote appearing posttraumatic deformity of the lateral and medial malleoli with periosteal thickening and ghost tracks from prior fixation noted as above. 2. 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 3. Calcaneal enthesophytes. 4. Thickened appearance along the distal Achilles. Tendinosis or injury to the Achilles might account for this appearance. Electronically Signed   By: Tollie Eth M.D.   On: 02/05/2018 14:19   Dg Knee Complete 4 Views Right  Result Date: 02/05/2018 CLINICAL DATA:  Patient fell on 01/20/2018 twisting the right knee. Knee is swollen but patient has been walking on it for the past 2 weeks. EXAM: RIGHT KNEE - COMPLETE 4+ VIEW COMPARISON:  None. FINDINGS: There is a late acute to subacute fracture involving the distal femur obliquely oriented undermining the lateral femoral condyle and extending into the knee joint just lateral to the femoral condylar notch. Periosteal new bone formation  is developing along the lateral aspect of the distal femoral metadiaphysis and lateral condyle. There is lateral displacement of the fracture fragment by at least 7 mm and proximal migration as well causing loss of congruity at the knee joint. Large suprapatellar joint effusion is identified. The patellofemoral compartment is maintained with degenerative change present. The proximal tibia and fibula appear intact. IMPRESSION: 1. There is an intra-articular, displaced fracture of the lateral femoral condyle with periosteal new bone formation suggesting a late acute to subacute injury. These results will be called to the ordering clinician or representative by the Radiologist Assistant, and communication documented in the  PACS or zVision Dashboard. 2. Large suprapatellar joint effusion is noted. Electronically Signed   By: Tollie Eth M.D.   On: 02/05/2018 14:16   Dg Foot Complete Right  Result Date: 02/05/2018 CLINICAL DATA:  The patient fell down stairs 16 days ago injuring the foot. History of previous right ankle fracture. EXAM: RIGHT FOOT COMPLETE - 3+ VIEW COMPARISON:  Right ankle series of February 05, 2018 FINDINGS: The bones are subjectively osteopenic. The phalanges and metatarsals are intact. There is chronic deformity of the shafts of the second through fourth proximal phalanges which may be post traumatic or developmental. Acute fractures are not observed. There are degenerative changes of the DIP joints of the third through fifth toes and of the fifth MTP joint. The tarsometatarsal and intertarsal joint spaces are reasonably well-maintained. There are a extensive osteolytic changes of the ankle. There is diffuse soft tissue swelling over the midfoot and hindfoot. There are plantar and Achilles region calcaneal spurs. IMPRESSION: No definite acute fractures of the foot. Significant chronic changes as described. Diffuse soft tissue swelling. Abnormal appearance of the distal tibia and fibula which  likely reflects chronic post traumatic, neuropathic, or infectious processes. Electronically Signed   By: David  [place] M.D.   On: 02/05/2018 14:08    Procedures Procedures (including critical care time)  Medications Ordered in UC Medications - No data to display  Initial Impression / Assessment and Plan / UC Course  I have reviewed the triage vital signs and the nursing notes.  Pertinent labs & imaging results that were available during my care of the patient were reviewed by me and considered in my medical decision making (see chart for details).      Final Clinical Impressions(s) / UC Diagnoses   Final diagnoses:  Closed displaced fracture of distal epiphysis of right femur, initial encounter [facility])     Discharge Instructions     Elevate leg Follow up with Dr. Odis Luster (Orthopedist at [facility] in [place]) tomorrow     ED Prescriptions    Medication Sig Dispense Auth. Provider   HYDROcodone-acetaminophen (NORCO/VICODIN) 5-325 MG tablet Take 1-2 tablets by mouth every 6 (six) hours as needed. 10 tablet Payton Mccallum, MD     1. x-ray results and diagnosis reviewed with patient  2. Knee immobilized with a knee immobilizer; continue non weightbearing with walker  3. rx as per orders above; reviewed possible side effects, interactions, risks and benefits  4. Recommend supportive treatment with elevation 5. Follow up tomorrow with Dr. Odis Luster at [facility] (case was discussed with Dr. Odis Luster, on call for [facility] "unassigned" patients,  who stated he could see patient tomorrow at [facility])   Controlled Substance Prescriptions [place] Controlled Substance Registry consulted? Not Applicable   Payton Mccallum, MD 02/05/18 [id number]

## 2018-02-05 NOTE — ED Triage Notes (Addendum)
Patient in today c/o right knee swelling after falling on 01/20/18 and twisting his knee. Patient also states that his right foot has been swelling since the fall.

## 2018-03-19 DIAGNOSIS — S8990XA Unspecified injury of unspecified lower leg, initial encounter: Secondary | ICD-10-CM

## 2018-03-19 HISTORY — DX: Unspecified injury of unspecified lower leg, initial encounter: S89.90XA

## 2018-04-22 ENCOUNTER — Encounter: Payer: Self-pay | Admitting: Family Medicine

## 2018-04-22 ENCOUNTER — Ambulatory Visit
Admission: RE | Admit: 2018-04-22 | Discharge: 2018-04-22 | Disposition: A | Discharge status: Home / Self Care | Payer: Medicare Other | Source: Ambulatory Visit | Attending: Family Medicine | Admitting: Family Medicine

## 2018-04-22 ENCOUNTER — Other Ambulatory Visit: Payer: Self-pay

## 2018-04-22 ENCOUNTER — Ambulatory Visit
Admission: RE | Admit: 2018-04-22 | Discharge: 2018-04-22 | Disposition: A | Discharge status: Home / Self Care | Payer: Medicare Other | Attending: Family Medicine | Admitting: Family Medicine

## 2018-04-22 ENCOUNTER — Ambulatory Visit (INDEPENDENT_AMBULATORY_CARE_PROVIDER_SITE_OTHER): Payer: Medicare Other | Admitting: Family Medicine

## 2018-04-22 VITALS — BP 118/67 | HR 88 | Resp 16 | Ht 77.0 in | Wt 195.0 lb

## 2018-04-22 DIAGNOSIS — S72491S Other fracture of lower end of right femur, sequela: Secondary | ICD-10-CM

## 2018-04-22 DIAGNOSIS — Z23 Encounter for immunization: Secondary | ICD-10-CM | POA: Diagnosis not present

## 2018-04-22 DIAGNOSIS — Z7689 Persons encountering health services in other specified circumstances: Secondary | ICD-10-CM | POA: Diagnosis not present

## 2018-04-22 NOTE — Progress Notes (Signed)
Date:  04/22/2018   Name:  Shane [name]   DOB:  [birth year]   MRN:  [id number]   Chief Complaint: Establish Care  Patient is a 68 year old male who presents for an establishment of care exam. The patient reports the following problems: right leg pain. Health maintenance has been reviewed needs pneumococcal 23.   Review of Systems  Constitutional: Negative for chills and fever.  HENT: Negative for drooling, ear discharge, ear pain and sore throat.   Respiratory: Negative for cough, shortness of breath and wheezing.   Cardiovascular: Negative for chest pain, palpitations and leg swelling.  Gastrointestinal: Negative for abdominal pain, blood in stool, constipation, diarrhea and nausea.  Endocrine: Negative for polydipsia.  Genitourinary: Negative for dysuria, frequency, hematuria and urgency.  Musculoskeletal: Negative for back pain, myalgias and neck pain.  Skin: Negative for rash.  Allergic/Immunologic: Negative for environmental allergies.  Neurological: Negative for dizziness and headaches.  Hematological: Does not bruise/bleed easily.  Psychiatric/Behavioral: Negative for suicidal ideas. The patient is not nervous/anxious.     There are no active problems to display for this patient.   No Known Allergies  Past Surgical History:  Procedure Laterality Date  . FRACTURE SURGERY Right    foot    Social History   Tobacco Use  . Smoking status: Current Every Day Smoker    Packs/day: 0.50    Years: 42.00    Pack years: 21.00    Types: Cigarettes  . Smokeless tobacco: Never Used  Substance Use Topics  . Alcohol use: Yes    Alcohol/week: 6.0 standard drinks    Types: 6 Cans of beer per week    Comment: occassionally during week with social friends   . Drug use: Never     Medication list has been reviewed and updated.  Current Meds  Medication Sig  . Multiple Vitamins-Minerals (ENERGY BOOSTER PO) Take by mouth.  . [DISCONTINUED] HYDROcodone-acetaminophen  (NORCO/VICODIN) 5-325 MG tablet Take 1-2 tablets by mouth every 6 (six) hours as needed.    No flowsheet data found.  Physical Exam Vitals signs and nursing note reviewed.  HENT:     Head: Normocephalic.     Right Ear: External ear normal.     Left Ear: External ear normal.     Nose: Nose normal.  Eyes:     General: No scleral icterus.       Right eye: No discharge.        Left eye: No discharge.     Conjunctiva/sclera: Conjunctivae normal.     Pupils: Pupils are equal, round, and reactive to light.  Neck:     Musculoskeletal: Normal range of motion and neck supple.     Thyroid: No thyromegaly.     Vascular: No JVD.     Trachea: No tracheal deviation.  Cardiovascular:     Rate and Rhythm: Normal rate and regular rhythm.     Heart sounds: Normal heart sounds. No murmur. No friction rub. No gallop.   Pulmonary:     Effort: No respiratory distress.     Breath sounds: Normal breath sounds. No wheezing or rales.  Abdominal:     General: Bowel sounds are normal.     Palpations: Abdomen is soft. There is no mass.     Tenderness: There is no abdominal tenderness. There is no guarding or rebound.  Musculoskeletal: Normal range of motion.        General: No tenderness.     Right knee: He  exhibits swelling and deformity. He exhibits normal range of motion, no effusion, no ecchymosis, normal alignment, no LCL laxity and no bony tenderness. No medial joint line, no lateral joint line, no MCL, no LCL and no patellar tendon tenderness noted.  Lymphadenopathy:     Cervical: No cervical adenopathy.  Skin:    General: Skin is warm.     Findings: No rash.  Neurological:     Mental Status: He is alert and oriented to person, place, and time.     Cranial Nerves: No cranial nerve deficit.     Deep Tendon Reflexes: Reflexes are normal and symmetric.     BP 118/67   Pulse 88   Resp 16   Ht 6\' 5"  (1.956 m)   Wt 195 lb (88.5 kg)   SpO2 98%   BMI 23.12 kg/m   Assessment and Plan: 1.  Other closed fracture of distal end of right femur, sequela Patient had a closed fracture that was documented and in urgent care visit earlier however he did not go to his orthopedic evaluation will repeat his knee exam to see to what extent healing has occurred and that there is still any issue with solution of fracture. - DG Knee Complete 4 Views Right; Future  2. Establishing care with new doctor, encounter for Patient establish care with new physician BS diagnoses medications and tests were reviewed.  3. Need for pneumococcal vaccination Discussed and administered. - Pneumococcal conjugate vaccine 13-valent

## 2018-04-24 ENCOUNTER — Other Ambulatory Visit: Payer: Self-pay

## 2018-04-24 DIAGNOSIS — S72491S Other fracture of lower end of right femur, sequela: Secondary | ICD-10-CM

## 2018-04-24 NOTE — Progress Notes (Unsigned)
Referral to ortho

## 2018-07-11 ENCOUNTER — Ambulatory Visit: Payer: Commercial Managed Care - HMO | Admitting: Family Medicine

## 2018-07-23 ENCOUNTER — Ambulatory Visit: Payer: Commercial Managed Care - HMO | Admitting: Family Medicine

## 2018-07-23 ENCOUNTER — Encounter: Payer: Self-pay | Admitting: Family Medicine

## 2018-07-24 ENCOUNTER — Ambulatory Visit: Payer: Commercial Managed Care - HMO | Admitting: Family Medicine

## 2018-10-02 ENCOUNTER — Telehealth: Payer: Self-pay | Admitting: Family Medicine

## 2018-10-02 NOTE — Telephone Encounter (Signed)
Patient called about a port line that was put in him from [facility] and he was requesting meds from dr Ronnald Ramp. Baxter Flattery spoke with him to say he would need to contact [facility] which is his primary and tell them that he's having issues with his line port.

## 2018-10-22 DIAGNOSIS — S82851F Displaced trimalleolar fracture of right lower leg, subsequent encounter for open fracture type IIIA, IIIB, or IIIC with routine healing: Secondary | ICD-10-CM | POA: Diagnosis not present

## 2018-11-10 ENCOUNTER — Ambulatory Visit: Payer: Medicare Other | Admitting: Family Medicine

## 2018-11-21 DIAGNOSIS — Z23 Encounter for immunization: Secondary | ICD-10-CM | POA: Diagnosis not present

## 2019-04-22 ENCOUNTER — Telehealth: Payer: Self-pay

## 2019-04-22 NOTE — Telephone Encounter (Signed)
Talked to sister concerning care. I explained to her he needs to continue care at [facility] and we have one in [place]. I gave her the doctor's names he was seeing in [place] to give to [facility] in [facility]

## 2019-04-25 IMAGING — CR DG KNEE COMPLETE 4+V*R*
4 series · 5 of 5 positions shown · non-contrast
Comparison: Radiographs February 05, 2018.

CLINICAL DATA: Other closed fracture of distal end of right femur,
sequela.

EXAM:
RIGHT KNEE - COMPLETE 4+ VIEW

[knee ap]
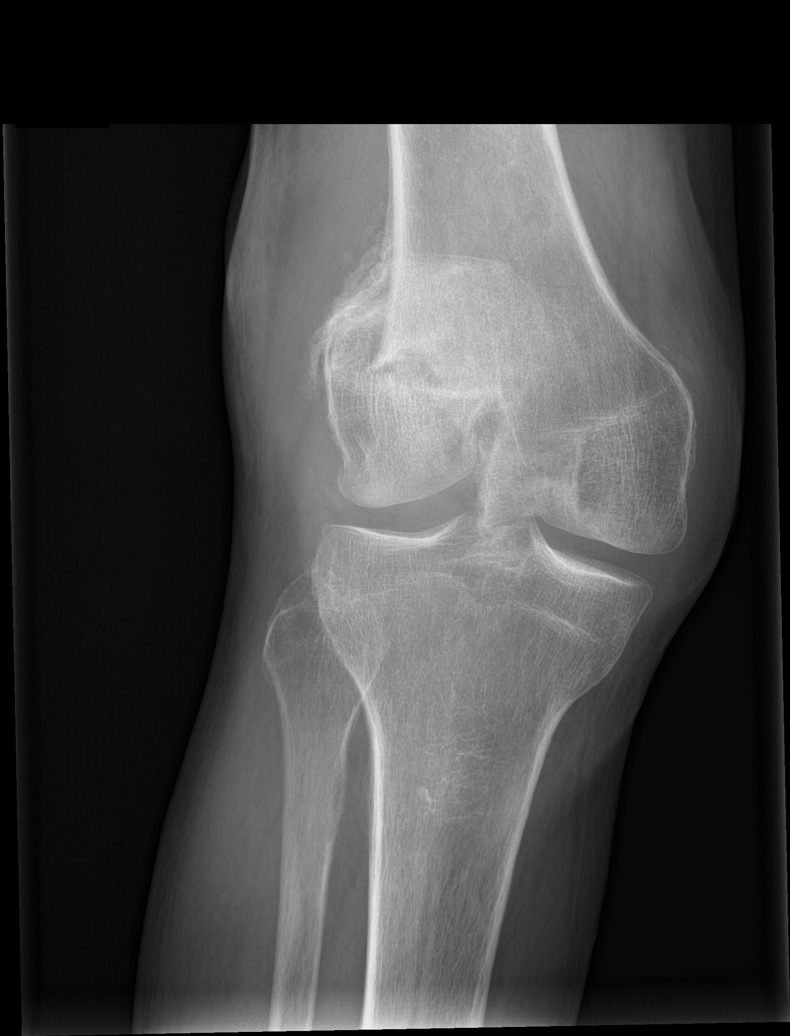

[Series 2: knee lat · 0.14mm/px · 2 of 2 slices shown]
[im 1/2]
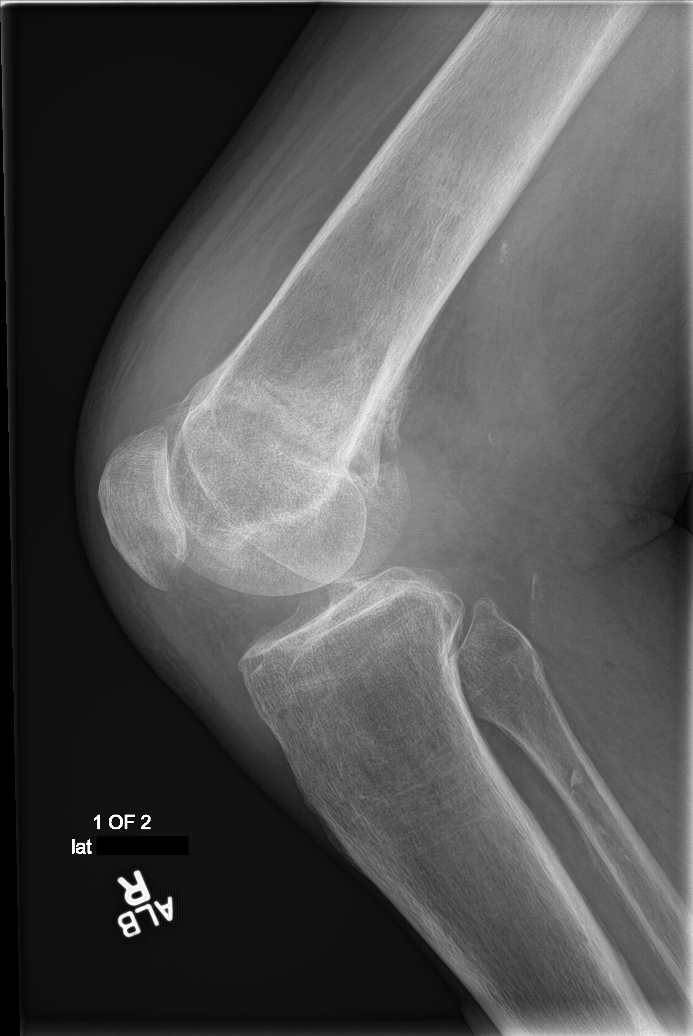
[im 2/2]
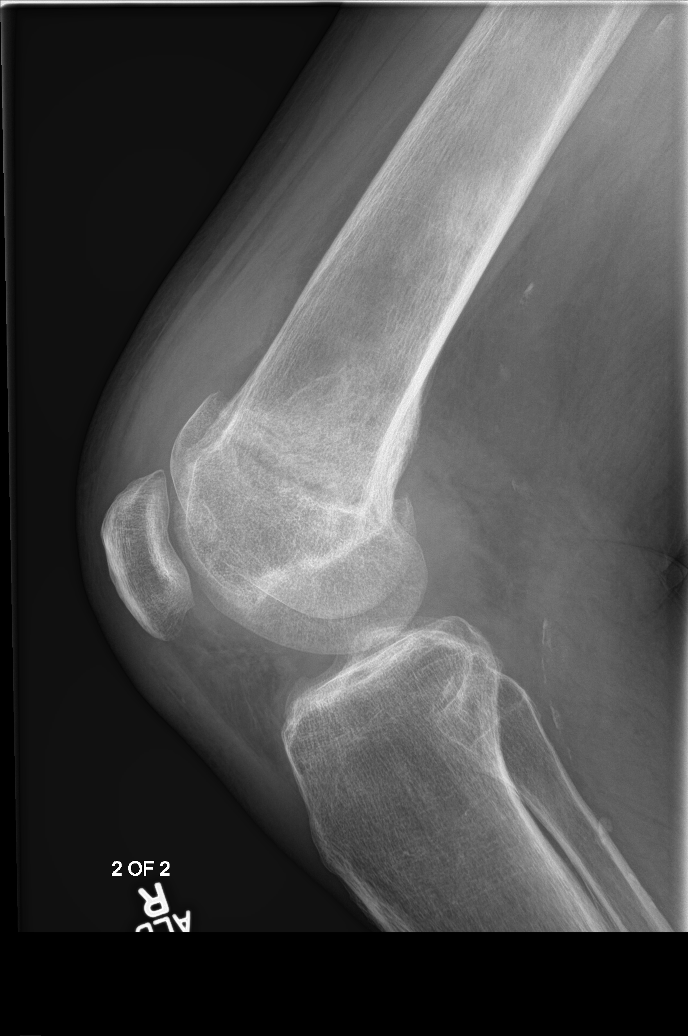

[tunnel]
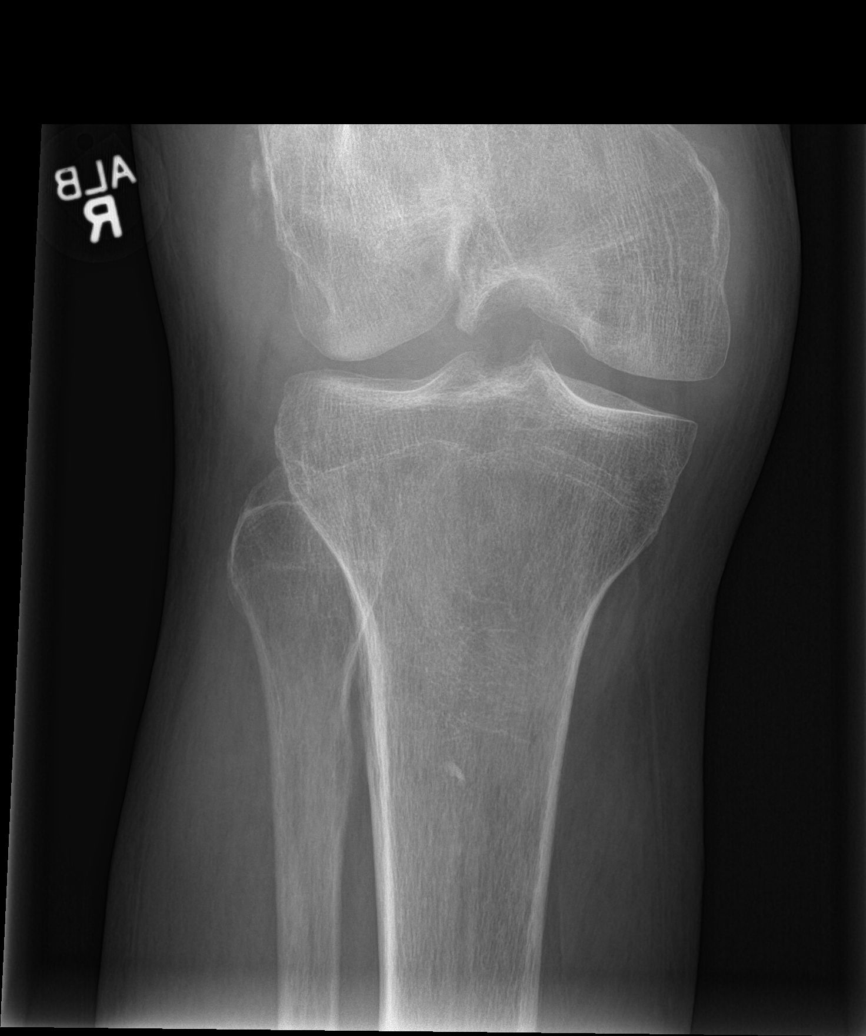

[patella skyline]
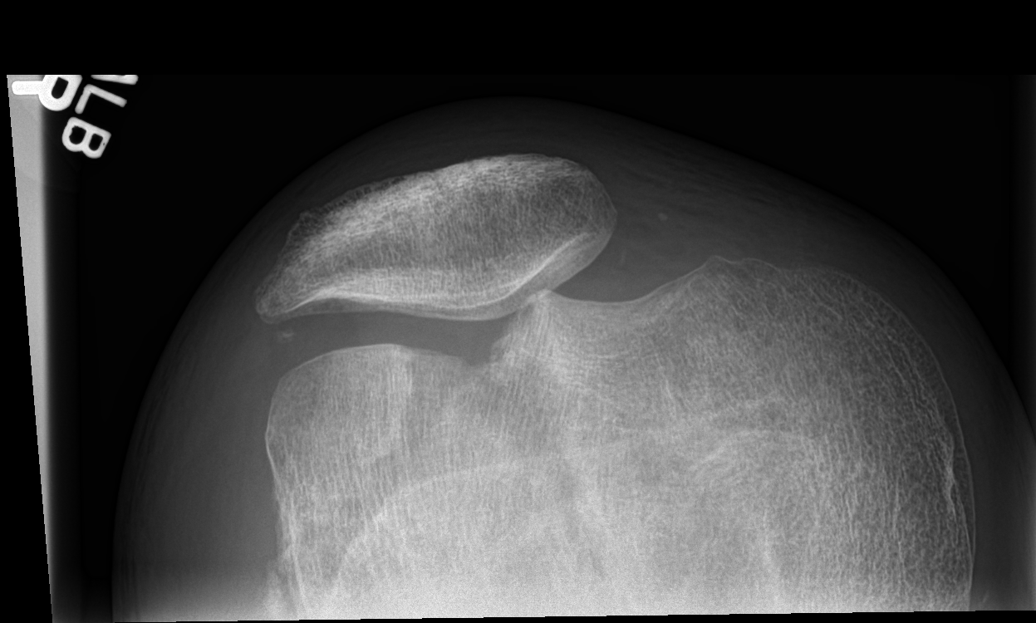

[5 of 5 positions shown; findings below may reference images not displayed]

FINDINGS: There is again noted deformity of the distal right femur, secondary
to old moderately displaced fracture involving the lateral femoral
condyle and supracondylar region. Callus formation is noted
suggesting some healing, although persistent fracture line is noted
concerning for nonunion. The tibia and fibula are unremarkable. Mild
suprapatellar joint effusion is noted.
IMPRESSION: Old moderately displaced fracture is seen involving the lateral
femoral condyle and lateral supracondylar region which appears to be
more displaced than noted on prior exam. Callus formation is noted
suggesting some healing, but persistent fracture line remains
concerning for nonunion. CT scan is recommended for further
evaluation.

## 2021-11-14 ENCOUNTER — Ambulatory Visit: Payer: Medicare Other | Attending: Family Medicine

## 2021-11-14 DIAGNOSIS — M6281 Muscle weakness (generalized): Secondary | ICD-10-CM | POA: Insufficient documentation

## 2021-11-14 DIAGNOSIS — R2681 Unsteadiness on feet: Secondary | ICD-10-CM | POA: Insufficient documentation

## 2021-11-14 NOTE — Therapy (Signed)
OUTPATIENT PHYSICAL THERAPY BALANCE EVALUATION   Patient Name: Shane [name] MRN: [id number] DOB:06-May-1950, 71 y.o., male Today's Date: 11/16/2021   PT End of Session - 11/16/21 0819     Visit Number 1    Number of Visits 25    Date for PT Re-Evaluation 02/08/22    Authorization Type eval: 11/16/21    PT Start Time 0830    PT Stop Time 0915    PT Time Calculation (min) 45 min    Equipment Utilized During Treatment Gait belt    Activity Tolerance Patient tolerated treatment well    Behavior During Therapy [facility] for tasks assessed/performed             Past Medical History:  Diagnosis Date   Knee injury 03/19/2018   Leg injury 12/17/2017   Past Surgical History:  Procedure Laterality Date   FRACTURE SURGERY Right    foot   There are no problems to display for this patient.   PCP: No primary care provider on file.  REFERRING PROVIDER: Kennis Carina, MD  REFERRING DIAGNOSIS: R26.81 (ICD-10-CM) - Unsteadiness on feet, Z91.81 (ICD-10-CM) - History of falling  THERAPY DIAG: Unsteadiness on feet  Muscle weakness (generalized)  RATIONALE FOR EVALUATION AND TREATMENT: Rehabilitation  ONSET DATE: Pt unsure  FOLLOW UP APPT WITH PROVIDER: Yes    SUBJECTIVE:                                                                                                                                                                                         Chief Complaint: Unsteadiness/gait instability  Pertinent History Patient has a history of gait instability and unsteadiness since his open trimalleolar R ankle fracture 12/20/2016 which required an external fixator and resulted in an infection. Pt states that afterward he was in a "nursing home." He denies any falls in the last 6 months but states that he is much slower now and has to take his time when walking or working in the yard. He uses a single point cane for ambulation and occasionally an old walker that he has around the  house. Pt states that his MD is ordering him a new walker. He was referred to physical therapy to assess his risk for falls and to provide recommendations about safe assistive device use. He was referred to physical therapy last year but never pursued treatment. Pt reports he is currently Independent on both ADLS and IADLS. His neighbor, Jimmey Ralph, drives him to the store to get groceries.  He lives in a mobile home with 3 steps to enter and no rails. He has a tub shower (no shower seat or grab  bars) but mostly cleans himself at the sink. He has normal height commodes and no BSC. PMH includes arrhythmia, DVT, HTN, COPD, food insecurity, and smoking;  Pain: No Numbness/Tingling: No Focal Weakness: No Recent changes in overall health/medication: No, pt denies  Prior history of physical therapy for balance:  Yes, when admitted to SNF Falls: Has patient fallen in last 6 months? No Dominant hand: right Prior level of function: Independent Occupational demands: Not working Hobbies: working in the yard, fishing, watching television Red flags (bowel/bladder changes, saddle paresthesia, personal history of cancer, h/o spinal tumors, h/o compression fx, chills/fever, night sweats, nausea, vomiting): Negative  Precautions: None  Weight Bearing Restrictions: No  Living Environment Lives with: lives alone, no family/friends nearby Lives in: Mobile home, 3 steps to enter, no rails Has following equipment at home: Single point cane and Environmental consultant - 2 wheeled, doctor was going to order a new walker for patient, has tub shower (no seat or grab bars) but mostly showers in the sink, normal height commode, no BSC; Independent with ADLs/IADLs (has a neighbor TEFL teacher who takes him to the store);  Patient Goals: Pt is unsure, "I can already do everything."     OBJECTIVE:   Patient Surveys  FOTO: 40, predicted improvement to 79 ABC: Deferred   Cognition Patient is oriented to person, place, and time.  Recent  memory is intact.  Remote memory is intact.  Attention span and concentration are intact.  Expressive speech is intact.  Patient's fund of knowledge is within normal limits for educational level      Gross Musculoskeletal Assessment Tremor: None Bulk: Normal Tone: Normal   GAIT: Severe R knee and R ankle valgus with slow gait speed and use of single point cane in RUE. Decreased toe to floor clearance bilaterally but especially in RLE where he appears to have minimal R ankle dorsiflexion and mostly walks on his forefoot;  Posture: Forward head/rounded shoulders with severe R knee and R ankle valgus in standing, forward trunk lean from waist;   AROM: Deferred   LE MMT:  MMT (out of 5) Right 11/16/2021 Left 11/16/2021  Hip flexion 4+ 4+  Hip extension    Hip abduction (seated) 4 4  Hip adduction 4+ 4+  Hip internal rotation    Hip external rotation    Knee flexion (seated) 5 5  Knee extension 5 5  Ankle dorsiflexion 4 4  Ankle plantarflexion    Ankle inversion    Ankle eversion    (* = pain; Blank rows = not tested)  Sensation Deferred  Reflexes Deferred  Cranial Nerves Visual acuity and visual fields are intact, pt reports wearing reading glasses; Extraocular muscles are intact  Facial sensation not tested; Facial strength is intact bilaterally  Hearing is normal as tested by gross conversation Palate elevates midline, normal phonation  Shoulder shrug strength is intact  Tongue protrudes midline   Coordination/Cerebellar Deferred   FUNCTIONAL OUTCOME MEASURES   Results Comments  BERG 44/56 Fall risk, in need of intervention  DGI    FGA    TUG 20.7 seconds Fall risk, in need of intervention  5TSTS Unable to perform sit to stand Fall risk, in need of intervention  6 Minute Walk Test    10 Meter Gait Speed Self-selected: 15.1s = 0.66 m/s; Fastest: 13.7s = 0.73 m/s Below normative values for full community ambulation  (Blank rows = not  tested)   TODAY'S TREATMENT  None   PATIENT EDUCATION:  Education details: Plan of  care Person educated: Patient Education method: Explanation Education comprehension: verbalized understanding   HOME EXERCISE PROGRAM: None currently   ASSESSMENT:  CLINICAL IMPRESSION: Patient is a 71 y.o. male who was seen today for physical therapy evaluation and treatment for imbalance. Objective impairments include Abnormal gait, decreased balance, difficulty walking, and decreased strength. High fall risk with BERG of 44/56. He is unable to perform sit to stand without UE assistance. Abnormal self-selected and fastest 67m gait speed and TUG. These impairments are limiting patient from meal prep, cleaning, shopping, community activity, and yard work. Personal factors including Age, Past/current experiences, Time since onset of injury/illness/exacerbation, and 3+ comorbidities: COPD, food insecurity, smoker, and prior R ankle fracture  are also affecting patient's functional outcome. Patient will benefit from skilled PT to address above impairments and improve overall function.  REHAB POTENTIAL: Fair    CLINICAL DECISION MAKING: Unstable/unpredictable  EVALUATION COMPLEXITY: High   GOALS: Goals reviewed with patient? No  SHORT TERM GOALS: Target date: 12/28/2021  Pt will be independent with HEP in order to improve strength and balance in order to decrease fall risk and improve function at home. Baseline:  Goal status: INITIAL   LONG TERM GOALS: Target date: 02/08/2022  Pt will increase FOTO to at least 65 to demonstrate significant improvement in function at home related to balance  Baseline: 11/16/21: 64 Goal status: INITIAL  2.  Pt will improve BERG by at least 3 points in order to demonstrate clinically significant improvement in balance.   Baseline: 11/16/21: 44/56 Goal status: INITIAL  3.  Pt will increase self-selected by at least 0.13 m/s in order to demonstrate  clinically significant improvement in community ambulation.       Baseline: Self-selected: 15.1s = 0.66 m/s; Goal status: INITIAL  4. Pt will be able to perform sit to stand without UE assistance in order to demonstrate clinically significant improvement in LE strength and decrease in fall risk  Baseline: 11/16/21: Unable to perform sit to stand without heavy UE assist Goal status: INITIAL  5. Pt will decrease TUG to below 14 seconds/decrease in order to demonstrate decreased fall risk.  Baseline: 11/16/21: 20.7s Goal status: INITIAL   PLAN: PT FREQUENCY: 1-2x/week  PT DURATION: 12 weeks  PLANNED INTERVENTIONS: Therapeutic exercises, Therapeutic activity, Neuromuscular re-education, Balance training, Gait training, Patient/Family education, Joint manipulation, Joint mobilization, Canalith repositioning, Aquatic Therapy, Dry Needling, Cognitive remediation, Electrical stimulation, Spinal manipulation, Spinal mobilization, Cryotherapy, Moist heat, Traction, Ultrasound, Ionotophoresis 4mg /ml Dexamethasone, and Manual therapy  PLAN FOR NEXT SESSION: Initiate balance/strength exercises, issue HEP;   Khylei Wilms PT, DPT, GCS  [place] 11/16/2021, 9:25 AM

## 2021-11-16 ENCOUNTER — Ambulatory Visit: Payer: Medicare Other

## 2021-11-16 DIAGNOSIS — R2681 Unsteadiness on feet: Secondary | ICD-10-CM | POA: Diagnosis present

## 2021-11-16 DIAGNOSIS — M6281 Muscle weakness (generalized): Secondary | ICD-10-CM | POA: Diagnosis present

## 2021-11-23 ENCOUNTER — Ambulatory Visit: Payer: Medicare Other | Attending: Family Medicine

## 2021-11-23 DIAGNOSIS — M6281 Muscle weakness (generalized): Secondary | ICD-10-CM | POA: Insufficient documentation

## 2021-11-23 DIAGNOSIS — R2681 Unsteadiness on feet: Secondary | ICD-10-CM | POA: Diagnosis present

## 2021-11-23 NOTE — Therapy (Signed)
OUTPATIENT PHYSICAL THERAPY TREATMENT   Patient Name: KILE KABLER MRN: [id number] DOB:1951-02-12, 71 y.o., male Today's Date: 11/23/2021   PT End of Session - 11/23/21 0934     Visit Number 2    Number of Visits 25    Date for PT Re-Evaluation 02/08/22    Authorization Type eval: 11/16/21    PT Start Time 0940    PT Stop Time 1015    PT Time Calculation (min) 35 min    Equipment Utilized During Treatment Gait belt    Activity Tolerance Patient tolerated treatment well    Behavior During Therapy [facility] for tasks assessed/performed             Past Medical History:  Diagnosis Date   Knee injury 03/19/2018   Leg injury 12/17/2017   Past Surgical History:  Procedure Laterality Date   FRACTURE SURGERY Right    foot   There are no problems to display for this patient.   PCP: Patient, No Pcp Per  REFERRING PROVIDER: Kennis Carina, MD  REFERRING DIAGNOSIS: R26.81 (ICD-10-CM) - Unsteadiness on feet, Z91.81 (ICD-10-CM) - History of falling  THERAPY DIAG: Unsteadiness on feet  Muscle weakness (generalized)  RATIONALE FOR EVALUATION AND TREATMENT: Rehabilitation  ONSET DATE: Pt unsure  FOLLOW UP APPT WITH PROVIDER: Yes    SUBJECTIVE:  Pt denies pain currently. Denies falls. Reports overall doing well.                                                                                                                                                                                        Chief Complaint: Unsteadiness/gait instability  Pertinent History Patient has a history of gait instability and unsteadiness since his open trimalleolar R ankle fracture 12/20/2016 which required an external fixator and resulted in an infection. Pt states that afterward he was in a "nursing home." He denies any falls in the last 6 months but states that he is much slower now and has to take his time when walking or working in the yard. He uses a single point cane for ambulation and  occasionally an old walker that he has around the house. Pt states that his MD is ordering him a new walker. He was referred to physical therapy to assess his risk for falls and to provide recommendations about safe assistive device use. He was referred to physical therapy last year but never pursued treatment. Pt reports he is currently Independent on both ADLS and IADLS. His neighbor, Jimmey Ralph, drives him to the store to get groceries.  He lives in a mobile home with 3 steps to enter and no rails. He has a  tub shower (no shower seat or grab bars) but mostly cleans himself at the sink. He has normal height commodes and no BSC. PMH includes arrhythmia, DVT, HTN, COPD, food insecurity, and smoking;  Pain: No Numbness/Tingling: No Focal Weakness: No Recent changes in overall health/medication: No, pt denies  Prior history of physical therapy for balance:  Yes, when admitted to SNF Falls: Has patient fallen in last 6 months? No Dominant hand: right Prior level of function: Independent Occupational demands: Not working Hobbies: working in the yard, fishing, watching television Red flags (bowel/bladder changes, saddle paresthesia, personal history of cancer, h/o spinal tumors, h/o compression fx, chills/fever, night sweats, nausea, vomiting): Negative  Precautions: None  Weight Bearing Restrictions: No  Living Environment Lives with: lives alone, no family/friends nearby Lives in: Mobile home, 3 steps to enter, no rails Has following equipment at home: Single point cane and Environmental consultant - 2 wheeled, doctor was going to order a new walker for patient, has tub shower (no seat or grab bars) but mostly showers in the sink, normal height commode, no BSC; Independent with ADLs/IADLs (has a neighbor TEFL teacher who takes him to the store);  Patient Goals: Pt is unsure, "I can already do everything."     OBJECTIVE:   Patient Surveys  FOTO: 7, predicted improvement to 55 ABC: Deferred   Cognition Patient is  oriented to person, place, and time.  Recent memory is intact.  Remote memory is intact.  Attention span and concentration are intact.  Expressive speech is intact.  Patient's fund of knowledge is within normal limits for educational level      Gross Musculoskeletal Assessment Tremor: None Bulk: Normal Tone: Normal   GAIT: Severe R knee and R ankle valgus with slow gait speed and use of single point cane in RUE. Decreased toe to floor clearance bilaterally but especially in RLE where he appears to have minimal R ankle dorsiflexion and mostly walks on his forefoot;  Posture: Forward head/rounded shoulders with severe R knee and R ankle valgus in standing, forward trunk lean from waist;   AROM: Deferred   LE MMT:  MMT (out of 5) Right 11/23/2021 Left 11/23/2021  Hip flexion 4+ 4+  Hip extension    Hip abduction (seated) 4 4  Hip adduction 4+ 4+  Hip internal rotation    Hip external rotation    Knee flexion (seated) 5 5  Knee extension 5 5  Ankle dorsiflexion 4 4  Ankle plantarflexion    Ankle inversion    Ankle eversion    (* = pain; Blank rows = not tested)  Sensation Deferred  Reflexes Deferred  Cranial Nerves Visual acuity and visual fields are intact, pt reports wearing reading glasses; Extraocular muscles are intact  Facial sensation not tested; Facial strength is intact bilaterally  Hearing is normal as tested by gross conversation Palate elevates midline, normal phonation  Shoulder shrug strength is intact  Tongue protrudes midline   Coordination/Cerebellar Deferred   FUNCTIONAL OUTCOME MEASURES   Results Comments  BERG 44/56 Fall risk, in need of intervention  DGI    FGA    TUG 20.7 seconds Fall risk, in need of intervention  5TSTS Unable to perform sit to stand Fall risk, in need of intervention  6 Minute Walk Test    10 Meter Gait Speed Self-selected: 15.1s = 0.66 m/s; Fastest: 13.7s = 0.73 m/s Below normative values for full community  ambulation  (Blank rows = not tested)   TODAY'S TREATMENT: 11/23/2021  There.ex: Nu-step  L2 for 5 minutes during interval history  STS from standard height chair: 2x6, BUE support to stand, SUE support for improved eccentric control to sit. SBA.  Standing Hip abd with BUE support: mod TC's for upright posture. Decreased eccentric control bilat but L > R due to limited stance time on RLE.   Seated heel to toe raises: 2x12/direction     Balance exercises at // bar:    Feet together eyes open: 2x30 sec. Min AP sway and use of ankle strategy    Feet together eyes closed: 3x30 sec. Moderate AP sway reliant on ankle strategy    Tandem with each LE under BOS: 2x30 sec. Frequent need for SUE support. CGA.    Education provided on HEP (Reps/sets/frequency).     PATIENT EDUCATION:  Education details: form/technique with exercise. HEP Person educated: Patient Education method: Explanation Education comprehension: verbalized understanding   HOME EXERCISE PROGRAM: Access Code: [email or web address] URL: [email or web address] Date: 11/23/2021 Prepared by: Ronnie Derby  Exercises - Sit to Stand with Armchair  - 1 x daily - 3 x weekly - 2 sets - 8 reps - Seated Heel Toe Raises  - 1 x daily - 3 x weekly - 2 sets - 12 reps - Standing Hip Abduction with Counter Support  - 1 x daily - 3 x weekly - 2 sets - 12 reps   ASSESSMENT:  CLINICAL IMPRESSION: Pt late to session thus limiting full treatment session. Focus of session on developing HEP. Deferred balance exercises for HEP due to increased risk of falls but pt able to demonstrate safe ability to complete strengthening exercises with use of chair and counter support. Pt will continue to benefit from skilled PT services to address deficits in strength, balance, and gait to reduce risk of falls.   REHAB POTENTIAL: Fair    CLINICAL DECISION MAKING: Unstable/unpredictable  EVALUATION COMPLEXITY: High   GOALS: Goals reviewed with  patient? No  SHORT TERM GOALS: Target date: 12/28/2021  Pt will be independent with HEP in order to improve strength and balance in order to decrease fall risk and improve function at home. Baseline:  Goal status: INITIAL   LONG TERM GOALS: Target date: 02/08/2022  Pt will increase FOTO to at least 65 to demonstrate significant improvement in function at home related to balance  Baseline: 11/16/21: 64 Goal status: INITIAL  2.  Pt will improve BERG by at least 3 points in order to demonstrate clinically significant improvement in balance.   Baseline: 11/16/21: 44/56 Goal status: INITIAL  3.  Pt will increase self-selected by at least 0.13 m/s in order to demonstrate clinically significant improvement in community ambulation.       Baseline: Self-selected: 15.1s = 0.66 m/s; Goal status: INITIAL  4. Pt will be able to perform sit to stand without UE assistance in order to demonstrate clinically significant improvement in LE strength and decrease in fall risk  Baseline: 11/16/21: Unable to perform sit to stand without heavy UE assist Goal status: INITIAL  5. Pt will decrease TUG to below 14 seconds/decrease in order to demonstrate decreased fall risk.  Baseline: 11/16/21: 20.7s Goal status: INITIAL   PLAN: PT FREQUENCY: 1-2x/week  PT DURATION: 12 weeks  PLANNED INTERVENTIONS: Therapeutic exercises, Therapeutic activity, Neuromuscular re-education, Balance training, Gait training, Patient/Family education, Joint manipulation, Joint mobilization, Canalith repositioning, Aquatic Therapy, Dry Needling, Cognitive remediation, Electrical stimulation, Spinal manipulation, Spinal mobilization, Cryotherapy, Moist heat, Traction, Ultrasound, Ionotophoresis 4mg /ml Dexamethasone, and Manual therapy  PLAN FOR NEXT SESSION: Reassess HEP,  balance, gait, strengthening   Delphia Grates. Fairly IV, PT, DPT Physical Therapist- [place]  [facility] 11/23/2021, 11:05 AM

## 2021-11-28 NOTE — Therapy (Signed)
OUTPATIENT PHYSICAL THERAPY TREATMENT   Patient Name: Shane [name] MRN: [id number] DOB:Jul 09, 1950, 71 y.o., male Today's Date: 11/29/2021   PT End of Session - 11/29/21 0928     Visit Number 3    Number of Visits 25    Date for PT Re-Evaluation 02/08/22    Authorization Type eval: 11/16/21    PT Start Time 0930    PT Stop Time 1015    PT Time Calculation (min) 45 min    Equipment Utilized During Treatment Gait belt    Activity Tolerance Patient tolerated treatment well    Behavior During Therapy [facility] for tasks assessed/performed             Past Medical History:  Diagnosis Date   Knee injury 03/19/2018   Leg injury 12/17/2017   Past Surgical History:  Procedure Laterality Date   FRACTURE SURGERY Right    foot   There are no problems to display for this patient.   PCP: Patient, No Pcp Per  REFERRING PROVIDER: Kennis Carina, MD  REFERRING DIAGNOSIS: R26.81 (ICD-10-CM) - Unsteadiness on feet, Z91.81 (ICD-10-CM) - History of falling  THERAPY DIAG: Unsteadiness on feet  Muscle weakness (generalized)  RATIONALE FOR EVALUATION AND TREATMENT: Rehabilitation  ONSET DATE: Pt unsure  FOLLOW UP APPT WITH PROVIDER: Yes    FROM INITIAL EVALUATION SUBJECTIVE:  Pt denies pain currently. Denies falls. Reports overall doing well.                                                                                                                                                                                        Chief Complaint: Unsteadiness/gait instability  Pertinent History Patient has a history of gait instability and unsteadiness since his open trimalleolar R ankle fracture 12/20/2016 which required an external fixator and resulted in an infection. Pt states that afterward he was in a "nursing home." He denies any falls in the last 6 months but states that he is much slower now and has to take his time when walking or working in the yard. He uses a single point cane for  ambulation and occasionally an old walker that he has around the house. Pt states that his MD is ordering him a new walker. He was referred to physical therapy to assess his risk for falls and to provide recommendations about safe assistive device use. He was referred to physical therapy last year but never pursued treatment. Pt reports he is currently Independent on both ADLS and IADLS. His neighbor, Jimmey Ralph, drives him to the store to get groceries.  He lives in a mobile home with 3 steps to enter and no rails.  He has a tub shower (no shower seat or grab bars) but mostly cleans himself at the sink. He has normal height commodes and no BSC. PMH includes arrhythmia, DVT, HTN, COPD, food insecurity, and smoking;  Pain: No Numbness/Tingling: No Focal Weakness: No Recent changes in overall health/medication: No, pt denies  Prior history of physical therapy for balance:  Yes, when admitted to SNF Falls: Has patient fallen in last 6 months? No Dominant hand: right Prior level of function: Independent Occupational demands: Not working Hobbies: working in the yard, fishing, watching television Red flags (bowel/bladder changes, saddle paresthesia, personal history of cancer, h/o spinal tumors, h/o compression fx, chills/fever, night sweats, nausea, vomiting): Negative  Precautions: None  Weight Bearing Restrictions: No  Living Environment Lives with: lives alone, no family/friends nearby Lives in: Mobile home, 3 steps to enter, no rails Has following equipment at home: Single point cane and Environmental consultant - 2 wheeled, doctor was going to order a new walker for patient, has tub shower (no seat or grab bars) but mostly showers in the sink, normal height commode, no BSC; Independent with ADLs/IADLs (has a neighbor TEFL teacher who takes him to the store);  Patient Goals: Pt is unsure, "I can already do everything."     OBJECTIVE:   Patient Surveys  FOTO: 86, predicted improvement to 32 ABC:  Deferred   Cognition Patient is oriented to person, place, and time.  Recent memory is intact.  Remote memory is intact.  Attention span and concentration are intact.  Expressive speech is intact.  Patient's fund of knowledge is within normal limits for educational level      Gross Musculoskeletal Assessment Tremor: None Bulk: Normal Tone: Normal   GAIT: Severe R knee and R ankle valgus with slow gait speed and use of single point cane in RUE. Decreased toe to floor clearance bilaterally but especially in RLE where he appears to have minimal R ankle dorsiflexion and mostly walks on his forefoot;  Posture: Forward head/rounded shoulders with severe R knee and R ankle valgus in standing, forward trunk lean from waist;   AROM: Deferred   LE MMT:  MMT (out of 5) Right 11/29/2021 Left 11/29/2021  Hip flexion 4+ 4+  Hip extension    Hip abduction (seated) 4 4  Hip adduction 4+ 4+  Hip internal rotation    Hip external rotation    Knee flexion (seated) 5 5  Knee extension 5 5  Ankle dorsiflexion 4 4  Ankle plantarflexion    Ankle inversion    Ankle eversion    (* = pain; Blank rows = not tested)  Sensation Deferred  Reflexes Deferred  Cranial Nerves Visual acuity and visual fields are intact, pt reports wearing reading glasses; Extraocular muscles are intact  Facial sensation not tested; Facial strength is intact bilaterally  Hearing is normal as tested by gross conversation Palate elevates midline, normal phonation  Shoulder shrug strength is intact  Tongue protrudes midline   Coordination/Cerebellar Deferred   FUNCTIONAL OUTCOME MEASURES   Results Comments  BERG 44/56 Fall risk, in need of intervention  DGI    FGA    TUG 20.7 seconds Fall risk, in need of intervention  5TSTS Unable to perform sit to stand Fall risk, in need of intervention  6 Minute Walk Test    10 Meter Gait Speed Self-selected: 15.1s = 0.66 m/s; Fastest: 13.7s = 0.73 m/s Below  normative values for full community ambulation  (Blank rows = not tested)   TODAY'S TREATMENT  SUBJECTIVE: Pt reports that he is doing well today. No changes since the last therapy session and no falls. Denies pain upon arrival. No specific questions or concerns currently.   PAIN: Denies   Ther-ex  Nu-step L1-3 x 6 minutes during interval history for warm-up with therapist adjusting resistance for appropriate challenge;  Sit to stand from standard height chair with Airex pad on seat 2 x 10; Standing heel raises with BUE support 2 x 15;  Standing hip strengthening with 10# ankle weights (AW): Hip flexion marches x 10 BLE; Hamstring curls x 10 BLE; Hip abduction x 10 BLE; Hip extension x 10 BLE;  Seated LAQ with 3# AW x 10 BLE;   Neuromuscular Re-education  Balance exercises at // bar without UE support unless otherwise specified;  Feet together (FT) eyes open (EO) /closed (EC) x 30s each; FT EO with horizontal and vertical head turns x 30s each; Semitandem (ST) balance alternating forward LE x 30s each; 6" alternating step taps x 10 BLE;   PATIENT EDUCATION:  Education details: Pt educated throughout session about proper posture and technique with exercises. Improved exercise technique, movement at target joints, use of target muscles after min to mod verbal, visual, tactile cues. Person educated: Patient Education method: Explanation Education comprehension: verbalized understanding   HOME EXERCISE PROGRAM: Access Code: [email or web address] URL: [email or web address] Date: 11/23/2021 Prepared by: Ronnie Derby  Exercises - Sit to Stand with Armchair  - 1 x daily - 3 x weekly - 2 sets - 8 reps - Seated Heel Toe Raises  - 1 x daily - 3 x weekly - 2 sets - 12 reps - Standing Hip Abduction with Counter Support  - 1 x daily - 3 x weekly - 2 sets - 12 reps   ASSESSMENT:  CLINICAL IMPRESSION: Pt demonstrates excellent motivation during session today. Progressed  strengthening and balance exercises during session. He continues to have difficulty due to weakness in RLE and limited R ankle ROM. Pt encouraged to continue HEP and follow-up as scheduled. Pt will continue to benefit from skilled PT services to address deficits in strength, balance, and gait to reduce risk of falls.   REHAB POTENTIAL: Fair    CLINICAL DECISION MAKING: Unstable/unpredictable  EVALUATION COMPLEXITY: High   GOALS: Goals reviewed with patient? No  SHORT TERM GOALS: Target date: 12/28/2021  Pt will be independent with HEP in order to improve strength and balance in order to decrease fall risk and improve function at home. Baseline:  Goal status: INITIAL   LONG TERM GOALS: Target date: 02/08/2022  Pt will increase FOTO to at least 65 to demonstrate significant improvement in function at home related to balance  Baseline: 11/16/21: 64 Goal status: INITIAL  2.  Pt will improve BERG by at least 3 points in order to demonstrate clinically significant improvement in balance.   Baseline: 11/16/21: 44/56 Goal status: INITIAL  3.  Pt will increase self-selected by at least 0.13 m/s in order to demonstrate clinically significant improvement in community ambulation.       Baseline: Self-selected: 15.1s = 0.66 m/s; Goal status: INITIAL  4. Pt will be able to perform sit to stand without UE assistance in order to demonstrate clinically significant improvement in LE strength and decrease in fall risk  Baseline: 11/16/21: Unable to perform sit to stand without heavy UE assist Goal status: INITIAL  5. Pt will decrease TUG to below 14 seconds/decrease in order to demonstrate decreased fall risk.  Baseline: 11/16/21: 20.7s Goal status: INITIAL  PLAN: PT FREQUENCY: 1-2x/week  PT DURATION: 12 weeks  PLANNED INTERVENTIONS: Therapeutic exercises, Therapeutic activity, Neuromuscular re-education, Balance training, Gait training, Patient/Family education, Joint manipulation,  Joint mobilization, Canalith repositioning, Aquatic Therapy, Dry Needling, Cognitive remediation, Electrical stimulation, Spinal manipulation, Spinal mobilization, Cryotherapy, Moist heat, Traction, Ultrasound, Ionotophoresis 4mg /ml Dexamethasone, and Manual therapy  PLAN FOR NEXT SESSION: Reassess HEP, balance, gait, strengthening   PT, DPT, GCS  Physical Therapist- [place]  [facility] 11/29/2021, 11:09 AM

## 2021-11-29 ENCOUNTER — Ambulatory Visit: Payer: Medicare Other

## 2021-11-29 DIAGNOSIS — R2681 Unsteadiness on feet: Secondary | ICD-10-CM | POA: Diagnosis not present

## 2021-11-29 DIAGNOSIS — M6281 Muscle weakness (generalized): Secondary | ICD-10-CM

## 2021-12-05 NOTE — Therapy (Signed)
OUTPATIENT PHYSICAL THERAPY TREATMENT   Patient Name: Shane LarkCarol F [name] MRN: [id number] DOB:12-09-1950, 71 y.o., male Today's Date: 12/06/2021   PT End of Session - 12/06/21 0758     Visit Number 4    Number of Visits 25    Date for PT Re-Evaluation 02/08/22    Authorization Type eval: 11/16/21    PT Start Time 0800    PT Stop Time 0842    PT Time Calculation (min) 42 min    Equipment Utilized During Treatment Gait belt    Activity Tolerance Patient tolerated treatment well    Behavior [facility] Therapy [facility] for tasks assessed/performed            Past Medical History:  Diagnosis Date   Knee injury 03/19/2018   Leg injury 12/17/2017   Past Surgical History:  Procedure Laterality Date   FRACTURE SURGERY Right    foot   There are no problems to display for this patient.   PCP: Patient, No Pcp Per  REFERRING PROVIDER: Kennis CarinaHarper, Sean S, MD  REFERRING DIAGNOSIS: R26.81 (ICD-10-CM) - Unsteadiness on feet, Z91.81 (ICD-10-CM) - History of falling  THERAPY DIAG: Unsteadiness on feet  Muscle weakness (generalized)  RATIONALE FOR EVALUATION AND TREATMENT: Rehabilitation  ONSET DATE: Pt unsure  FOLLOW UP APPT WITH PROVIDER: Yes    FROM INITIAL EVALUATION SUBJECTIVE:  Pt denies pain currently. Denies falls. Reports overall doing well.                                                                                                                                                                                        Chief Complaint: Unsteadiness/gait instability  Pertinent History Patient has a history of gait instability and unsteadiness since his open trimalleolar R ankle fracture 12/20/2016 which required an external fixator and resulted in an infection. Pt states that afterward he was in a "nursing home." He denies any falls in the last 6 months but states that he is much slower now and has to take his time when walking or working in the yard. He uses a single point cane for  ambulation and occasionally an old walker that he has around the house. Pt states that his MD is ordering him a new walker. He was referred to physical therapy to assess his risk for falls and to provide recommendations about safe assistive device use. He was referred to physical therapy last year but never pursued treatment. Pt reports he is currently Independent on both ADLS and IADLS. His neighbor, Jimmey Ralpharker, drives him to the store to get groceries.  He lives in a mobile home with 3 steps to enter and no rails. He  has a tub shower (no shower seat or grab bars) but mostly cleans himself at the sink. He has normal height commodes and no BSC. PMH includes arrhythmia, DVT, HTN, COPD, food insecurity, and smoking;  Pain: No Numbness/Tingling: No Focal Weakness: No Recent changes in overall health/medication: No, pt denies  Prior history of physical therapy for balance:  Yes, when admitted to SNF Falls: Has patient fallen in last 6 months? No Dominant hand: right Prior level of function: Independent Occupational demands: Not working Hobbies: working in the yard, fishing, watching television Red flags (bowel/bladder changes, saddle paresthesia, personal history of cancer, h/o spinal tumors, h/o compression fx, chills/fever, night sweats, nausea, vomiting): Negative  Precautions: None  Weight Bearing Restrictions: No  Living Environment Lives with: lives alone, no family/friends nearby Lives in: Mobile home, 3 steps to enter, no rails Has following equipment at home: Single point cane and Environmental consultant - 2 wheeled, doctor was going to order a new walker for patient, has tub shower (no seat or grab bars) but mostly showers in the sink, normal height commode, no BSC; Independent with ADLs/IADLs (has a neighbor Research scientist (life sciences) who takes him to the store);  Patient Goals: Pt is unsure, "I can already do everything."    OBJECTIVE:   Patient Surveys  FOTO: 41, predicted improvement to 63 ABC:  Deferred   Cognition Patient is oriented to person, place, and time.  Recent memory is intact.  Remote memory is intact.  Attention span and concentration are intact.  Expressive speech is intact.  Patient's fund of knowledge is within normal limits for educational level     Gross Musculoskeletal Assessment Tremor: None Bulk: Normal Tone: Normal  GAIT: Severe R knee and R ankle valgus with slow gait speed and use of single point cane in RUE. Decreased toe to floor clearance bilaterally but especially in RLE where he appears to have minimal R ankle dorsiflexion and mostly walks on his forefoot;  Posture: Forward head/rounded shoulders with severe R knee and R ankle valgus in standing, forward trunk lean from waist;  AROM: Deferred   LE MMT:  MMT (out of 5) Right 12/06/2021 Left 12/06/2021  Hip flexion 4+ 4+  Hip extension    Hip abduction (seated) 4 4  Hip adduction 4+ 4+  Hip internal rotation    Hip external rotation    Knee flexion (seated) 5 5  Knee extension 5 5  Ankle dorsiflexion 4 4  Ankle plantarflexion    Ankle inversion    Ankle eversion    (* = pain; Blank rows = not tested)  Sensation Deferred  Reflexes Deferred  Cranial Nerves Visual acuity and visual fields are intact, pt reports wearing reading glasses; Extraocular muscles are intact  Facial sensation not tested; Facial strength is intact bilaterally  Hearing is normal as tested by gross conversation Palate elevates midline, normal phonation  Shoulder shrug strength is intact  Tongue protrudes midline  Coordination/Cerebellar Deferred  FUNCTIONAL OUTCOME MEASURES   Results Comments  BERG 44/56 Fall risk, in need of intervention  DGI    FGA    TUG 20.7 seconds Fall risk, in need of intervention  5TSTS Unable to perform sit to stand Fall risk, in need of intervention  6 Minute Walk Test    10 Meter Gait Speed Self-selected: 15.1s = 0.66 m/s; Fastest: 13.7s = 0.73 m/s Below normative  values for full community ambulation  (Blank rows = not tested)   TODAY'S TREATMENT   SUBJECTIVE: Pt reports that he is  doing well today. No changes since the last therapy session and no falls. Denies pain upon arrival. No specific questions or concerns currently.   PAIN: Denies   Ther-ex  Nu-step L1-3 x 5 minutes during interval history for warm-up with therapist adjusting resistance for appropriate challenge;  Standing heel raises with BUE support 2 x 20; Seated LAQ with 5# AW 2 x 15 BLE; Side stepping with 5# AW x multiple lengths in // bars;  Standing hip strengthening with 5# ankle weights (AW): Hip flexion marches x 15 BLE; Hamstring curls x 15 BLE; Hip abduction x 15 BLE; Hip extension x 15 BLE;   Not performed: Sit to stand from standard height chair with Airex pad on seat 2 x 10; Feet together (FT) eyes open (EO) /closed (EC) x 30s each; FT EO with horizontal and vertical head turns x 30s each; Semitandem (ST) balance alternating forward LE x 30s each; 6" alternating step taps x 10 BLE;   PATIENT EDUCATION:  Education details: Pt educated throughout session about proper posture and technique with exercises. Improved exercise technique, movement at target joints, use of target muscles after min to mod verbal, visual, tactile cues. Person educated: Patient Education method: Explanation Education comprehension: verbalized understanding   HOME EXERCISE PROGRAM: Access Code: [email or web address] URL: [email or web address] Date: 11/23/2021 Prepared by: Ronnie Derby  Exercises - Sit to Stand with Armchair  - 1 x daily - 3 x weekly - 2 sets - 8 reps - Seated Heel Toe Raises  - 1 x daily - 3 x weekly - 2 sets - 12 reps - Standing Hip Abduction with Counter Support  - 1 x daily - 3 x weekly - 2 sets - 12 reps   ASSESSMENT:  CLINICAL IMPRESSION: Pt missed the first ten minutes of his appointment because he was outside smoking. Progressed strengthening today  which was the focus of the session. Balance exercises deferred due to time constraints. He continues to have difficulty due to weakness in RLE and limited R ankle ROM. Pt encouraged to continue HEP and follow-up as scheduled. Pt will continue to benefit from skilled PT services to address deficits in strength, balance, and gait to reduce risk of falls.   REHAB POTENTIAL: Fair    CLINICAL DECISION MAKING: Unstable/unpredictable  EVALUATION COMPLEXITY: High   GOALS: Goals reviewed with patient? No  SHORT TERM GOALS: Target date: 12/28/2021  Pt will be independent with HEP in order to improve strength and balance in order to decrease fall risk and improve function at home. Baseline:  Goal status: INITIAL   LONG TERM GOALS: Target date: 02/08/2022  Pt will increase FOTO to at least 65 to demonstrate significant improvement in function at home related to balance  Baseline: 11/16/21: 64 Goal status: INITIAL  2.  Pt will improve BERG by at least 3 points in order to demonstrate clinically significant improvement in balance.   Baseline: 11/16/21: 44/56 Goal status: INITIAL  3.  Pt will increase self-selected by at least 0.13 m/s in order to demonstrate clinically significant improvement in community ambulation.       Baseline: Self-selected: 15.1s = 0.66 m/s; Goal status: INITIAL  4. Pt will be able to perform sit to stand without UE assistance in order to demonstrate clinically significant improvement in LE strength and decrease in fall risk  Baseline: 11/16/21: Unable to perform sit to stand without heavy UE assist Goal status: INITIAL  5. Pt will decrease TUG to below 14 seconds/decrease in order to demonstrate decreased  fall risk.  Baseline: 11/16/21: 20.7s Goal status: INITIAL   PLAN: PT FREQUENCY: 1-2x/week  PT DURATION: 12 weeks  PLANNED INTERVENTIONS: Therapeutic exercises, Therapeutic activity, Neuromuscular re-education, Balance training, Gait training,  Patient/Family education, Joint manipulation, Joint mobilization, Canalith repositioning, Aquatic Therapy, Dry Needling, Cognitive remediation, Electrical stimulation, Spinal manipulation, Spinal mobilization, Cryotherapy, Moist heat, Traction, Ultrasound, Ionotophoresis 4mg /ml Dexamethasone, and Manual therapy  PLAN FOR NEXT SESSION: Reassess HEP, balance, gait, strengthening   PT, DPT, GCS  Physical Therapist- [place]  [facility] 12/06/2021, 8:47 AM

## 2021-12-06 ENCOUNTER — Ambulatory Visit: Payer: Medicare Other

## 2021-12-06 DIAGNOSIS — R2681 Unsteadiness on feet: Secondary | ICD-10-CM | POA: Diagnosis not present

## 2021-12-06 DIAGNOSIS — M6281 Muscle weakness (generalized): Secondary | ICD-10-CM

## 2021-12-13 ENCOUNTER — Ambulatory Visit: Payer: Medicare Other

## 2021-12-13 DIAGNOSIS — M6281 Muscle weakness (generalized): Secondary | ICD-10-CM

## 2021-12-13 DIAGNOSIS — R2681 Unsteadiness on feet: Secondary | ICD-10-CM | POA: Diagnosis not present

## 2021-12-13 NOTE — Therapy (Signed)
OUTPATIENT PHYSICAL THERAPY TREATMENT   Patient Name: Shane [name] MRN: [id number] DOB:March 01, 1951, 71 y.o., male Today's Date: 12/13/2021   PT End of Session - 12/13/21 0855     Visit Number 5    Number of Visits 25    Date for PT Re-Evaluation 02/08/22    Authorization Type eval: 11/16/21    PT Start Time 0855    PT Stop Time 0940    PT Time Calculation (min) 45 min    Equipment Utilized During Treatment Gait belt    Activity Tolerance Patient tolerated treatment well    Behavior During Therapy [facility] for tasks assessed/performed            Past Medical History:  Diagnosis Date   Knee injury 03/19/2018   Leg injury 12/17/2017   Past Surgical History:  Procedure Laterality Date   FRACTURE SURGERY Right    foot   There are no problems to display for this patient.   PCP: Patient, No Pcp Per  REFERRING PROVIDER: Ashley Jacobs, MD  REFERRING DIAGNOSIS: R26.81 (ICD-10-CM) - Unsteadiness on feet, Z91.81 (ICD-10-CM) - History of falling  THERAPY DIAG: Unsteadiness on feet  Muscle weakness (generalized)  RATIONALE FOR EVALUATION AND TREATMENT: Rehabilitation  ONSET DATE: Pt unsure  FOLLOW UP APPT WITH PROVIDER: Yes    FROM INITIAL EVALUATION SUBJECTIVE:  Pt denies pain currently. Denies falls. Reports overall doing well.                                                                                                                                                                                        Chief Complaint: Unsteadiness/gait instability  Pertinent History Patient has a history of gait instability and unsteadiness since his open trimalleolar R ankle fracture 12/20/2016 which required an external fixator and resulted in an infection. Pt states that afterward he was in a "nursing home." He denies any falls in the last 6 months but states that he is much slower now and has to take his time when walking or working in the yard. He uses a single point cane for  ambulation and occasionally an old walker that he has around the house. Pt states that his MD is ordering him a new walker. He was referred to physical therapy to assess his risk for falls and to provide recommendations about safe assistive device use. He was referred to physical therapy last year but never pursued treatment. Pt reports he is currently Independent on both ADLS and IADLS. His neighbor, Jerline Pain, drives him to the store to get groceries.  He lives in a mobile home with 3 steps to enter and no rails. He  has a tub shower (no shower seat or grab bars) but mostly cleans himself at the sink. He has normal height commodes and no BSC. PMH includes arrhythmia, DVT, HTN, COPD, food insecurity, and smoking;  Pain: No Numbness/Tingling: No Focal Weakness: No Recent changes in overall health/medication: No, pt denies  Prior history of physical therapy for balance:  Yes, when admitted to SNF Falls: Has patient fallen in last 6 months? No Dominant hand: right Prior level of function: Independent Occupational demands: Not working Hobbies: working in the yard, fishing, watching television Red flags (bowel/bladder changes, saddle paresthesia, personal history of cancer, h/o spinal tumors, h/o compression fx, chills/fever, night sweats, nausea, vomiting): Negative  Precautions: None  Weight Bearing Restrictions: No  Living Environment Lives with: lives alone, no family/friends nearby Lives in: Mobile home, 3 steps to enter, no rails Has following equipment at home: Single point cane and Environmental consultant - 2 wheeled, doctor was going to order a new walker for patient, has tub shower (no seat or grab bars) but mostly showers in the sink, normal height commode, no BSC; Independent with ADLs/IADLs (has a neighbor Research scientist (life sciences) who takes him to the store);  Patient Goals: Pt is unsure, "I can already do everything."    OBJECTIVE:   Patient Surveys  FOTO: 68, predicted improvement to 52 ABC:  Deferred   Cognition Patient is oriented to person, place, and time.  Recent memory is intact.  Remote memory is intact.  Attention span and concentration are intact.  Expressive speech is intact.  Patient's fund of knowledge is within normal limits for educational level     Gross Musculoskeletal Assessment Tremor: None Bulk: Normal Tone: Normal  GAIT: Severe R knee and R ankle valgus with slow gait speed and use of single point cane in RUE. Decreased toe to floor clearance bilaterally but especially in RLE where he appears to have minimal R ankle dorsiflexion and mostly walks on his forefoot;  Posture: Forward head/rounded shoulders with severe R knee and R ankle valgus in standing, forward trunk lean from waist;  AROM: Deferred   LE MMT:  MMT (out of 5) Right 12/13/2021 Left 12/13/2021  Hip flexion 4+ 4+  Hip extension    Hip abduction (seated) 4 4  Hip adduction 4+ 4+  Hip internal rotation    Hip external rotation    Knee flexion (seated) 5 5  Knee extension 5 5  Ankle dorsiflexion 4 4  Ankle plantarflexion    Ankle inversion    Ankle eversion    (* = pain; Blank rows = not tested)  Sensation Deferred  Reflexes Deferred  Cranial Nerves Visual acuity and visual fields are intact, pt reports wearing reading glasses; Extraocular muscles are intact  Facial sensation not tested; Facial strength is intact bilaterally  Hearing is normal as tested by gross conversation Palate elevates midline, normal phonation  Shoulder shrug strength is intact  Tongue protrudes midline  Coordination/Cerebellar Deferred  FUNCTIONAL OUTCOME MEASURES   Results Comments  BERG 44/56 Fall risk, in need of intervention  DGI    FGA    TUG 20.7 seconds Fall risk, in need of intervention  5TSTS Unable to perform sit to stand Fall risk, in need of intervention  6 Minute Walk Test    10 Meter Gait Speed Self-selected: 15.1s = 0.66 m/s; Fastest: 13.7s = 0.73 m/s Below normative  values for full community ambulation  (Blank rows = not tested)   TODAY'S TREATMENT   SUBJECTIVE: Pt reports that he is  doing well today. No changes since the last therapy session and no falls. Denies pain upon arrival. No specific questions or concerns currently.   PAIN: Denies   Ther-ex  Nu-step L1-4 x 10 minutes during interval history for warm-up with therapist adjusting resistance for appropriate challenge (5 minutes unbilled);  Squats x 15; Seated clams with blue tband resistance 2 x 20; Seated adductor ball squeeze 2 x 20;  Standing hip strengthening with 5# ankle weights (AW): Hip flexion marches x 20 BLE; Hamstring curls x 20 BLE; Hip abduction x 20 BLE; Hip extension x 20 BLE;  6" forward step-ups alternating forward LE x 10 leading with each leg;   Not performed: Sit to stand from standard height chair with Airex pad on seat 2 x 10; Feet together (FT) eyes open (EO) /closed (EC) x 30s each; FT EO with horizontal and vertical head turns x 30s each; Semitandem (ST) balance alternating forward LE x 30s each; 6" alternating step taps x 10 BLE; Standing heel raises with BUE support 2 x 20; Seated LAQ with 5# AW 2 x 15 BLE; Side stepping with 5# AW x multiple lengths in // bars;   PATIENT EDUCATION:  Education details: Pt educated throughout session about proper posture and technique with exercises. Improved exercise technique, movement at target joints, use of target muscles after min to mod verbal, visual, tactile cues. Person educated: Patient Education method: Explanation Education comprehension: verbalized understanding   HOME EXERCISE PROGRAM: Access Code: [email or web address] URL: [email or web address] Date: 11/23/2021 Prepared by: Larna Daughters  Exercises - Sit to Stand with Armchair  - 1 x daily - 3 x weekly - 2 sets - 8 reps - Seated Heel Toe Raises  - 1 x daily - 3 x weekly - 2 sets - 12 reps - Standing Hip Abduction with Counter Support  - 1  x daily - 3 x weekly - 2 sets - 12 reps   ASSESSMENT:  CLINICAL IMPRESSION: Progressed strengthening today which was the focus of the session. Balance exercises deferred. He continues to have difficulty due to weakness in RLE and limited R ankle ROM. Increased repetitions and added step-ups. Pt encouraged to continue HEP and follow-up as scheduled. Pt will continue to benefit from skilled PT services to address deficits in strength, balance, and gait to reduce risk of falls.   REHAB POTENTIAL: Fair    CLINICAL DECISION MAKING: Unstable/unpredictable  EVALUATION COMPLEXITY: High   GOALS: Goals reviewed with patient? No  SHORT TERM GOALS: Target date: 12/28/2021  Pt will be independent with HEP in order to improve strength and balance in order to decrease fall risk and improve function at home. Baseline:  Goal status: INITIAL   LONG TERM GOALS: Target date: 02/08/2022  Pt will increase FOTO to at least 65 to demonstrate significant improvement in function at home related to balance  Baseline: 11/16/21: 64 Goal status: INITIAL  2.  Pt will improve BERG by at least 3 points in order to demonstrate clinically significant improvement in balance.   Baseline: 11/16/21: 44/56 Goal status: INITIAL  3.  Pt will increase self-selected 10MWT by at least 0.13 m/s in order to demonstrate clinically significant improvement in community ambulation.       Baseline: Self-selected: 15.1s = 0.66 m/s; Goal status: INITIAL  4. Pt will be able to perform sit to stand without UE assistance in order to demonstrate clinically significant improvement in LE strength and decrease in fall risk  Baseline: 11/16/21: Unable to perform sit to stand  without heavy UE assist Goal status: INITIAL  5. Pt will decrease TUG to below 14 seconds/decrease in order to demonstrate decreased fall risk.  Baseline: 11/16/21: 20.7s Goal status: INITIAL   PLAN: PT FREQUENCY: 1-2x/week  PT DURATION: 12 weeks  PLANNED  INTERVENTIONS: Therapeutic exercises, Therapeutic activity, Neuromuscular re-education, Balance training, Gait training, Patient/Family education, Joint manipulation, Joint mobilization, Canalith repositioning, Aquatic Therapy, Dry Needling, Cognitive remediation, Electrical stimulation, Spinal manipulation, Spinal mobilization, Cryotherapy, Moist heat, Traction, Ultrasound, Ionotophoresis 4mg /ml Dexamethasone, and Manual therapy  PLAN FOR NEXT SESSION: Reassess HEP, balance, gait, strengthening   Phillips Grout PT, DPT, GCS  Physical Therapist- [facility] 12/13/2021, 9:46 AM

## 2021-12-19 NOTE — Therapy (Signed)
OUTPATIENT PHYSICAL THERAPY TREATMENT  Patient Name: Shane [name] MRN: [id number] DOB:October 31, 1950, 71 y.o., male Today's Date: 12/20/2021   PT End of Session - 12/20/21 0859     Visit Number 6    Number of Visits 25    Date for PT Re-Evaluation 02/08/22    Authorization Type eval: 11/16/21    PT Start Time 0854    PT Stop Time 0939    PT Time Calculation (min) 45 min    Equipment Utilized During Treatment Gait belt    Activity Tolerance Patient tolerated treatment well    Behavior During Therapy [facility] for tasks assessed/performed            Past Medical History:  Diagnosis Date   Knee injury 03/19/2018   Leg injury 12/17/2017   Past Surgical History:  Procedure Laterality Date   FRACTURE SURGERY Right    foot   There are no problems to display for this patient.  PCP: Patient, No Pcp Per  REFERRING PROVIDER: Ashley Jacobs, MD  REFERRING DIAGNOSIS: R26.81 (ICD-10-CM) - Unsteadiness on feet, Z91.81 (ICD-10-CM) - History of falling  THERAPY DIAG: Unsteadiness on feet  Muscle weakness (generalized)  RATIONALE FOR EVALUATION AND TREATMENT: Rehabilitation  ONSET DATE: Pt unsure  FOLLOW UP APPT WITH PROVIDER: Yes    FROM INITIAL EVALUATION SUBJECTIVE:  Pt denies pain currently. Denies falls. Reports overall doing well.                                                                                                                                                                                        Chief Complaint: Unsteadiness/gait instability  Pertinent History Patient has a history of gait instability and unsteadiness since his open trimalleolar R ankle fracture 12/20/2016 which required an external fixator and resulted in an infection. Pt states that afterward he was in a "nursing home." He denies any falls in the last 6 months but states that he is much slower now and has to take his time when walking or working in the yard. He uses a single point cane for  ambulation and occasionally an old walker that he has around the house. Pt states that his MD is ordering him a new walker. He was referred to physical therapy to assess his risk for falls and to provide recommendations about safe assistive device use. He was referred to physical therapy last year but never pursued treatment. Pt reports he is currently Independent on both ADLS and IADLS. His neighbor, Jerline Pain, drives him to the store to get groceries.  He lives in a mobile home with 3 steps to enter and no rails. He has a  tub shower (no shower seat or grab bars) but mostly cleans himself at the sink. He has normal height commodes and no BSC. PMH includes arrhythmia, DVT, HTN, COPD, food insecurity, and smoking;  Pain: No Numbness/Tingling: No Focal Weakness: No Recent changes in overall health/medication: No, pt denies  Prior history of physical therapy for balance:  Yes, when admitted to SNF Falls: Has patient fallen in last 6 months? No Dominant hand: right Prior level of function: Independent Occupational demands: Not working Hobbies: working in the yard, fishing, watching television Red flags (bowel/bladder changes, saddle paresthesia, personal history of cancer, h/o spinal tumors, h/o compression fx, chills/fever, night sweats, nausea, vomiting): Negative  Precautions: None  Weight Bearing Restrictions: No  Living Environment Lives with: lives alone, no family/friends nearby Lives in: Mobile home, 3 steps to enter, no rails Has following equipment at home: Single point cane and Environmental consultant - 2 wheeled, doctor was going to order a new walker for patient, has tub shower (no seat or grab bars) but mostly showers in the sink, normal height commode, no BSC; Independent with ADLs/IADLs (has a neighbor TEFL teacher who takes him to the store);  Patient Goals: Pt is unsure, "I can already do everything."    OBJECTIVE:   Patient Surveys  FOTO: 29, predicted improvement to 21 ABC:  Deferred   Cognition Patient is oriented to person, place, and time.  Recent memory is intact.  Remote memory is intact.  Attention span and concentration are intact.  Expressive speech is intact.  Patient's fund of knowledge is within normal limits for educational level     Gross Musculoskeletal Assessment Tremor: None Bulk: Normal Tone: Normal  GAIT: Severe R knee and R ankle valgus with slow gait speed and use of single point cane in RUE. Decreased toe to floor clearance bilaterally but especially in RLE where he appears to have minimal R ankle dorsiflexion and mostly walks on his forefoot;  Posture: Forward head/rounded shoulders with severe R knee and R ankle valgus in standing, forward trunk lean from waist;  AROM: Deferred  LE MMT:  MMT (out of 5) Right 12/20/2021 Left 12/20/2021  Hip flexion 4+ 4+  Hip extension    Hip abduction (seated) 4 4  Hip adduction 4+ 4+  Hip internal rotation    Hip external rotation    Knee flexion (seated) 5 5  Knee extension 5 5  Ankle dorsiflexion 4 4  Ankle plantarflexion    Ankle inversion    Ankle eversion    (* = pain; Blank rows = not tested)  Sensation Deferred  Reflexes Deferred  Cranial Nerves Visual acuity and visual fields are intact, pt reports wearing reading glasses; Extraocular muscles are intact  Facial sensation not tested; Facial strength is intact bilaterally  Hearing is normal as tested by gross conversation Palate elevates midline, normal phonation  Shoulder shrug strength is intact  Tongue protrudes midline  Coordination/Cerebellar Deferred  FUNCTIONAL OUTCOME MEASURES   Results Comments  BERG 44/56 Fall risk, in need of intervention  DGI    FGA    TUG 20.7 seconds Fall risk, in need of intervention  5TSTS Unable to perform sit to stand Fall risk, in need of intervention  6 Minute Walk Test    10 Meter Gait Speed Self-selected: 15.1s = 0.66 m/s; Fastest: 13.7s = 0.73 m/s Below normative  values for full community ambulation  (Blank rows = not tested)   TODAY'S TREATMENT   SUBJECTIVE: Pt reports that he is doing well today.  No changes since the last therapy session and no falls. Denies pain upon arrival. No specific questions or concerns currently.   PAIN: Denies   Ther-ex  Nu-step L1-5 x 10 minutes during interval history for warm-up with therapist adjusting resistance for appropriate challenge (5 minutes unbilled);  Sit to stand from regular height chair with 2 Airex pads on seat and no UE assist x 10; Squats x 20;  Standing hip strengthening with 5# ankle weights (AW): Hip flexion marches x 20 BLE; Hamstring curls x 20 BLE; Hip abduction x 20 BLE; Hip extension x 20 BLE;  Seated LAQ with 5# AW x 20 BLE; Side stepping with 5# AW x multiple lengths in // bars;  6" forward step-ups alternating forward LE x 15 leading with each leg; High knee marching gait in // bars x 30';   Not performed: Feet together (FT) eyes open (EO) /closed (EC) x 30s each; FT EO with horizontal and vertical head turns x 30s each; Semitandem (ST) balance alternating forward LE x 30s each; 6" alternating step taps x 10 BLE; Standing heel raises with BUE support 2 x 20; Seated clams with blue tband resistance 2 x 20; Seated adductor ball squeeze 2 x 20;   PATIENT EDUCATION:  Education details: Pt educated throughout session about proper posture and technique with exercises. Improved exercise technique, movement at target joints, use of target muscles after min to mod verbal, visual, tactile cues. Person educated: Patient Education method: Explanation Education comprehension: verbalized understanding   HOME EXERCISE PROGRAM: Access Code: [email or web address] URL: [email or web address] Date: 11/23/2021 Prepared by: Ronnie Derby  Exercises - Sit to Stand with Armchair  - 1 x daily - 3 x weekly - 2 sets - 8 reps - Seated Heel Toe Raises  - 1 x daily - 3 x weekly - 2 sets -  12 reps - Standing Hip Abduction with Counter Support  - 1 x daily - 3 x weekly - 2 sets - 12 reps   ASSESSMENT:  CLINICAL IMPRESSION: Progressed strengthening today which was the focus of the session. Balance exercises deferred. He continues to have difficulty due to weakness in RLE and limited R ankle ROM. Increased repetitions to provide additional challenge. Pt encouraged to continue HEP and follow-up as scheduled. Pt will continue to benefit from skilled PT services to address deficits in strength, balance, and gait to reduce risk of falls.   REHAB POTENTIAL: Fair    CLINICAL DECISION MAKING: Unstable/unpredictable  EVALUATION COMPLEXITY: High   GOALS: Goals reviewed with patient? No  SHORT TERM GOALS: Target date: 12/28/2021  Pt will be independent with HEP in order to improve strength and balance in order to decrease fall risk and improve function at home. Baseline:  Goal status: INITIAL   LONG TERM GOALS: Target date: 02/08/2022  Pt will increase FOTO to at least 65 to demonstrate significant improvement in function at home related to balance  Baseline: 11/16/21: 64 Goal status: INITIAL  2.  Pt will improve BERG by at least 3 points in order to demonstrate clinically significant improvement in balance.   Baseline: 11/16/21: 44/56 Goal status: INITIAL  3.  Pt will increase self-selected by at least 0.13 m/s in order to demonstrate clinically significant improvement in community ambulation.       Baseline: Self-selected: 15.1s = 0.66 m/s; Goal status: INITIAL  4. Pt will be able to perform sit to stand without UE assistance in order to demonstrate clinically significant improvement in LE strength and decrease in  fall risk  Baseline: 11/16/21: Unable to perform sit to stand without heavy UE assist Goal status: INITIAL  5. Pt will decrease TUG to below 14 seconds/decrease in order to demonstrate decreased fall risk.  Baseline: 11/16/21: 20.7s Goal status:  INITIAL   PLAN: PT FREQUENCY: 1-2x/week  PT DURATION: 12 weeks  PLANNED INTERVENTIONS: Therapeutic exercises, Therapeutic activity, Neuromuscular re-education, Balance training, Gait training, Patient/Family education, Joint manipulation, Joint mobilization, Canalith repositioning, Aquatic Therapy, Dry Needling, Cognitive remediation, Electrical stimulation, Spinal manipulation, Spinal mobilization, Cryotherapy, Moist heat, Traction, Ultrasound, Ionotophoresis 4mg /ml Dexamethasone, and Manual therapy  PLAN FOR NEXT SESSION: Reassess HEP, balance, gait, strengthening   PT, DPT, GCS  Physical Therapist- [place]  [facility] 12/20/2021, 9:52 AM

## 2021-12-20 ENCOUNTER — Ambulatory Visit: Payer: Medicare Other | Attending: Family Medicine

## 2021-12-20 DIAGNOSIS — M6281 Muscle weakness (generalized): Secondary | ICD-10-CM | POA: Diagnosis present

## 2021-12-20 DIAGNOSIS — R2681 Unsteadiness on feet: Secondary | ICD-10-CM | POA: Insufficient documentation

## 2021-12-27 ENCOUNTER — Ambulatory Visit: Payer: Medicare Other

## 2021-12-27 DIAGNOSIS — R2681 Unsteadiness on feet: Secondary | ICD-10-CM | POA: Diagnosis not present

## 2021-12-27 DIAGNOSIS — M6281 Muscle weakness (generalized): Secondary | ICD-10-CM

## 2021-12-27 NOTE — Therapy (Signed)
OUTPATIENT PHYSICAL THERAPY TREATMENT  Patient Name: Shane [name] MRN: [id number] DOB:May 06, 1950, 71 y.o., male Today's Date: 12/27/2021   PT End of Session - 12/27/21 0837     Visit Number 7    Number of Visits 25    Date for PT Re-Evaluation 02/08/22    Authorization Type eval: 11/16/21    PT Start Time 0840    PT Stop Time 0930    PT Time Calculation (min) 50 min    Equipment Utilized During Treatment Gait belt    Activity Tolerance Patient tolerated treatment well    Behavior During Therapy [facility] for tasks assessed/performed             Past Medical History:  Diagnosis Date   Knee injury 03/19/2018   Leg injury 12/17/2017   Past Surgical History:  Procedure Laterality Date   FRACTURE SURGERY Right    foot   There are no problems to display for this patient.  PCP: Patient, No Pcp Per  REFERRING PROVIDER: Kennis Carina, MD  REFERRING DIAGNOSIS: R26.81 (ICD-10-CM) - Unsteadiness on feet, Z91.81 (ICD-10-CM) - History of falling  THERAPY DIAG: Unsteadiness on feet  Muscle weakness (generalized)  RATIONALE FOR EVALUATION AND TREATMENT: Rehabilitation  ONSET DATE: Pt unsure  FOLLOW UP APPT WITH PROVIDER: Yes    FROM INITIAL EVALUATION SUBJECTIVE:  Pt denies pain currently. Denies falls. Reports overall doing well.                                                                                                                                                                                        Chief Complaint: Unsteadiness/gait instability  Pertinent History Patient has a history of gait instability and unsteadiness since his open trimalleolar R ankle fracture 12/20/2016 which required an external fixator and resulted in an infection. Pt states that afterward he was in a "nursing home." He denies any falls in the last 6 months but states that he is much slower now and has to take his time when walking or working in the yard. He uses a single point cane for  ambulation and occasionally an old walker that he has around the house. Pt states that his MD is ordering him a new walker. He was referred to physical therapy to assess his risk for falls and to provide recommendations about safe assistive device use. He was referred to physical therapy last year but never pursued treatment. Pt reports he is currently Independent on both ADLS and IADLS. His neighbor, Jimmey Ralph, drives him to the store to get groceries.  He lives in a mobile home with 3 steps to enter and no rails. He has  a tub shower (no shower seat or grab bars) but mostly cleans himself at the sink. He has normal height commodes and no BSC. PMH includes arrhythmia, DVT, HTN, COPD, food insecurity, and smoking;  Pain: No Numbness/Tingling: No Focal Weakness: No Recent changes in overall health/medication: No, pt denies  Prior history of physical therapy for balance:  Yes, when admitted to SNF Falls: Has patient fallen in last 6 months? No Dominant hand: right Prior level of function: Independent Occupational demands: Not working Hobbies: working in the yard, fishing, watching television Red flags (bowel/bladder changes, saddle paresthesia, personal history of cancer, h/o spinal tumors, h/o compression fx, chills/fever, night sweats, nausea, vomiting): Negative  Precautions: None  Weight Bearing Restrictions: No  Living Environment Lives with: lives alone, no family/friends nearby Lives in: Mobile home, 3 steps to enter, no rails Has following equipment at home: Single point cane and Environmental consultant - 2 wheeled, doctor was going to order a new walker for patient, has tub shower (no seat or grab bars) but mostly showers in the sink, normal height commode, no BSC; Independent with ADLs/IADLs (has a neighbor TEFL teacher who takes him to the store);  Patient Goals: Pt is unsure, "I can already do everything."    OBJECTIVE:   Patient Surveys  FOTO: 21, predicted improvement to 79 ABC:  Deferred   Cognition Patient is oriented to person, place, and time.  Recent memory is intact.  Remote memory is intact.  Attention span and concentration are intact.  Expressive speech is intact.  Patient's fund of knowledge is within normal limits for educational level     Gross Musculoskeletal Assessment Tremor: None Bulk: Normal Tone: Normal  GAIT: Severe R knee and R ankle valgus with slow gait speed and use of single point cane in RUE. Decreased toe to floor clearance bilaterally but especially in RLE where he appears to have minimal R ankle dorsiflexion and mostly walks on his forefoot;  Posture: Forward head/rounded shoulders with severe R knee and R ankle valgus in standing, forward trunk lean from waist;  AROM: Deferred  LE MMT:  MMT (out of 5) Right 12/27/2021 Left 12/27/2021  Hip flexion 4+ 4+  Hip extension    Hip abduction (seated) 4 4  Hip adduction 4+ 4+  Hip internal rotation    Hip external rotation    Knee flexion (seated) 5 5  Knee extension 5 5  Ankle dorsiflexion 4 4  Ankle plantarflexion    Ankle inversion    Ankle eversion    (* = pain; Blank rows = not tested)  Sensation Deferred  Reflexes Deferred  Cranial Nerves Visual acuity and visual fields are intact, pt reports wearing reading glasses; Extraocular muscles are intact  Facial sensation not tested; Facial strength is intact bilaterally  Hearing is normal as tested by gross conversation Palate elevates midline, normal phonation  Shoulder shrug strength is intact  Tongue protrudes midline  Coordination/Cerebellar Deferred  FUNCTIONAL OUTCOME MEASURES   Results Comments  BERG 44/56 Fall risk, in need of intervention  DGI    FGA    TUG 20.7 seconds Fall risk, in need of intervention  5TSTS Unable to perform sit to stand Fall risk, in need of intervention  6 Minute Walk Test    10 Meter Gait Speed Self-selected: 15.1s = 0.66 m/s; Fastest: 13.7s = 0.73 m/s Below normative  values for full community ambulation  (Blank rows = not tested)   TODAY'S TREATMENT   SUBJECTIVE: Pt reports that he is doing well  today. No changes since the last therapy session and no falls. Denies pain upon arrival. No specific questions or concerns currently.   PAIN: Denies   Ther-ex  Nu-step L1-5 x 12 minutes during interval history for warm-up with therapist adjusting resistance for appropriate challenge (10 minutes unbilled);  Squats x 20; High knee marching gait in // bars x 60'; Sit to stand from regular height chair with 2 Airex pads on seat and no UE assist x 10, 1 Airex pad x 10;;  Standing hip strengthening with 5# ankle weights (AW): Hip flexion marches x 20 BLE; Hamstring curls x 20 BLE; Hip abduction x 20 BLE; Hip extension x 20 BLE;  Seated LAQ with 5# AW x 20 BLE; 6" forward step-ups alternating forward LE with BUE support x 10 leading with each leg; Standing heel raises with BUE support x 20; Seated clams with blue tband resistance x 20; Seated adductor ball squeeze x 20;   Not performed: Feet together (FT) eyes open (EO) /closed (EC) x 30s each; FT EO with horizontal and vertical head turns x 30s each; Semitandem (ST) balance alternating forward LE x 30s each; 6" alternating step taps x 10 BLE; Side stepping with 5# AW x multiple lengths in // bars;   PATIENT EDUCATION:  Education details: Pt educated throughout session about proper posture and technique with exercises. Improved exercise technique, movement at target joints, use of target muscles after min to mod verbal, visual, tactile cues. Person educated: Patient Education method: Explanation Education comprehension: verbalized understanding   HOME EXERCISE PROGRAM: Access Code: [email or web address] URL: [email or web address] Date: 11/23/2021 Prepared by: Ronnie Derby  Exercises - Sit to Stand with Armchair  - 1 x daily - 3 x weekly - 2 sets - 8 reps - Seated Heel Toe Raises  - 1 x  daily - 3 x weekly - 2 sets - 12 reps - Standing Hip Abduction with Counter Support  - 1 x daily - 3 x weekly - 2 sets - 12 reps   ASSESSMENT:  CLINICAL IMPRESSION: Progressed strengthening again today with additional exercises including heel raises and seated clams.  Balance exercises deferred. He continues to have difficulty due to weakness in RLE and limited R ankle ROM as well as severe R knee valgus. Pt encouraged to continue HEP and follow-up as scheduled. No HEP modifications at this time. Pt will continue to benefit from skilled PT services to address deficits in strength, balance, and gait to reduce risk of falls.   REHAB POTENTIAL: Fair    CLINICAL DECISION MAKING: Unstable/unpredictable  EVALUATION COMPLEXITY: High   GOALS: Goals reviewed with patient? No  SHORT TERM GOALS: Target date: 12/28/2021  Pt will be independent with HEP in order to improve strength and balance in order to decrease fall risk and improve function at home. Baseline:  Goal status: INITIAL   LONG TERM GOALS: Target date: 02/08/2022  Pt will increase FOTO to at least 65 to demonstrate significant improvement in function at home related to balance  Baseline: 11/16/21: 64 Goal status: INITIAL  2.  Pt will improve BERG by at least 3 points in order to demonstrate clinically significant improvement in balance.   Baseline: 11/16/21: 44/56 Goal status: INITIAL  3.  Pt will increase self-selected by at least 0.13 m/s in order to demonstrate clinically significant improvement in community ambulation.       Baseline: Self-selected: 15.1s = 0.66 m/s; Goal status: INITIAL  4. Pt will be able to perform sit to stand  without UE assistance in order to demonstrate clinically significant improvement in LE strength and decrease in fall risk  Baseline: 11/16/21: Unable to perform sit to stand without heavy UE assist Goal status: INITIAL  5. Pt will decrease TUG to below 14 seconds/decrease in order to  demonstrate decreased fall risk.  Baseline: 11/16/21: 20.7s Goal status: INITIAL   PLAN: PT FREQUENCY: 1-2x/week  PT DURATION: 12 weeks  PLANNED INTERVENTIONS: Therapeutic exercises, Therapeutic activity, Neuromuscular re-education, Balance training, Gait training, Patient/Family education, Joint manipulation, Joint mobilization, Canalith repositioning, Aquatic Therapy, Dry Needling, Cognitive remediation, Electrical stimulation, Spinal manipulation, Spinal mobilization, Cryotherapy, Moist heat, Traction, Ultrasound, Ionotophoresis 4mg /ml Dexamethasone, and Manual therapy  PLAN FOR NEXT SESSION: Reassess HEP, balance, gait, strengthening   Phillips Grout PT, DPT, GCS  Physical Therapist- [facility] 12/27/2021, 1:48 PM

## 2022-01-02 NOTE — Therapy (Unsigned)
OUTPATIENT PHYSICAL THERAPY TREATMENT/DISCHARGE  Patient Name: Shane [name] MRN: [id number] DOB:07/06/1950, 71 y.o., male Today's Date: 01/04/2022   PT End of Session - 01/03/22 0853     Visit Number 8    Number of Visits 25    Date for PT Re-Evaluation 02/08/22    Authorization Type eval: 11/16/21    PT Start Time 0848    PT Stop Time 0930    PT Time Calculation (min) 42 min    Equipment Utilized During Treatment Gait belt    Activity Tolerance Patient tolerated treatment well    Behavior During Therapy [facility] for tasks assessed/performed            Past Medical History:  Diagnosis Date   Knee injury 03/19/2018   Leg injury 12/17/2017   Past Surgical History:  Procedure Laterality Date   FRACTURE SURGERY Right    foot   There are no problems to display for this patient.  PCP: Patient, No Pcp Per  REFERRING PROVIDER: Ashley Jacobs, MD  REFERRING DIAGNOSIS: R26.81 (ICD-10-CM) - Unsteadiness on feet, Z91.81 (ICD-10-CM) - History of falling  THERAPY DIAG: Unsteadiness on feet  Muscle weakness (generalized)  RATIONALE FOR EVALUATION AND TREATMENT: Rehabilitation  ONSET DATE: Pt unsure  FOLLOW UP APPT WITH PROVIDER: Yes    FROM INITIAL EVALUATION SUBJECTIVE:  Pt denies pain currently. Denies falls. Reports overall doing well.                                                                                                                                                                                        Chief Complaint: Unsteadiness/gait instability  Pertinent History Patient has a history of gait instability and unsteadiness since his open trimalleolar R ankle fracture 12/20/2016 which required an external fixator and resulted in an infection. Pt states that afterward he was in a "nursing home." He denies any falls in the last 6 months but states that he is much slower now and has to take his time when walking or working in the yard. He uses a single point cane  for ambulation and occasionally an old walker that he has around the house. Pt states that his MD is ordering him a new walker. He was referred to physical therapy to assess his risk for falls and to provide recommendations about safe assistive device use. He was referred to physical therapy last year but never pursued treatment. Pt reports he is currently Independent on both ADLS and IADLS. His neighbor, Jerline Pain, drives him to the store to get groceries.  He lives in a mobile home with 3 steps to enter and no rails. He has a  tub shower (no shower seat or grab bars) but mostly cleans himself at the sink. He has normal height commodes and no BSC. PMH includes arrhythmia, DVT, HTN, COPD, food insecurity, and smoking;  Pain: No Numbness/Tingling: No Focal Weakness: No Recent changes in overall health/medication: No, pt denies  Prior history of physical therapy for balance:  Yes, when admitted to SNF Falls: Has patient fallen in last 6 months? No Dominant hand: right Prior level of function: Independent Occupational demands: Not working Hobbies: working in the yard, fishing, watching television Red flags (bowel/bladder changes, saddle paresthesia, personal history of cancer, h/o spinal tumors, h/o compression fx, chills/fever, night sweats, nausea, vomiting): Negative  Precautions: None  Weight Bearing Restrictions: No  Living Environment Lives with: lives alone, no family/friends nearby Lives in: Mobile home, 3 steps to enter, no rails Has following equipment at home: Single point cane and Environmental consultant - 2 wheeled, doctor was going to order a new walker for patient, has tub shower (no seat or grab bars) but mostly showers in the sink, normal height commode, no BSC; Independent with ADLs/IADLs (has a neighbor Research scientist (life sciences) who takes him to the store);  Patient Goals: Pt is unsure, "I can already do everything."    OBJECTIVE:   Patient Surveys  FOTO: 92, predicted improvement to 49 ABC:  Deferred  Cognition Patient is oriented to person, place, and time.  Recent memory is intact.  Remote memory is intact.  Attention span and concentration are intact.  Expressive speech is intact.  Patient's fund of knowledge is within normal limits for educational level     Gross Musculoskeletal Assessment Tremor: None Bulk: Normal Tone: Normal  GAIT: Severe R knee and R ankle valgus with slow gait speed and use of single point cane in RUE. Decreased toe to floor clearance bilaterally but especially in RLE where he appears to have minimal R ankle dorsiflexion and mostly walks on his forefoot;  Posture: Forward head/rounded shoulders with severe R knee and R ankle valgus in standing, forward trunk lean from waist;  AROM: Deferred  LE MMT:  MMT (out of 5) Right 01/04/2022 Left 01/04/2022  Hip flexion 4+ 4+  Hip extension    Hip abduction (seated) 4 4  Hip adduction 4+ 4+  Hip internal rotation    Hip external rotation    Knee flexion (seated) 5 5  Knee extension 5 5  Ankle dorsiflexion 4 4  Ankle plantarflexion    Ankle inversion    Ankle eversion    (* = pain; Blank rows = not tested)  Sensation Deferred  Reflexes Deferred  Cranial Nerves Visual acuity and visual fields are intact, pt reports wearing reading glasses; Extraocular muscles are intact  Facial sensation not tested; Facial strength is intact bilaterally  Hearing is normal as tested by gross conversation Palate elevates midline, normal phonation  Shoulder shrug strength is intact  Tongue protrudes midline  Coordination/Cerebellar Deferred  FUNCTIONAL OUTCOME MEASURES   Results Comments  BERG 44/56 Fall risk, in need of intervention  DGI    FGA    TUG 20.7 seconds Fall risk, in need of intervention  5TSTS Unable to perform sit to stand Fall risk, in need of intervention  6 Minute Walk Test    10 Meter Gait Speed Self-selected: 15.1s = 0.66 m/s; Fastest: 13.7s = 0.73 m/s Below normative  values for full community ambulation  (Blank rows = not tested)   TODAY'S TREATMENT   SUBJECTIVE: Pt reports that he is doing well today. No  changes since the last therapy session and no falls. Denies pain upon arrival. No specific questions or concerns currently.   PAIN: Denies   Ther-ex  Nu-step L1-5 x 7 minutes during interval history for warm-up with therapist adjusting resistance for appropriate challenge (2 minutes unbilled);  Updated outcome measures with patient:  FUNCTIONAL OUTCOME MEASURES   Results 01/03/22  BERG 44/56 46/56  DGI    FGA    TUG 20.7 seconds 18.2 seconds  5TSTS Unable to perform sit to stand without UE assist Unable to perform sit to stand without UE assist  6 Minute Walk Test    10 Meter Gait Speed Self-selected: 15.1s = 0.66 m/s; Fastest: 13.7s = 0.73 m/s Self-selected: 12.4s = 0.81 m/s;  (Blank rows = not tested)  Squats x 20; High knee marching gait in // bars x 60';  Standing hip strengthening with 5# ankle weights (AW): Hip flexion marches x 20 BLE; Hamstring curls x 20 BLE;   PATIENT EDUCATION:  Education details: Pt educated throughout session about proper posture and technique with exercises. Improved exercise technique, movement at target joints, use of target muscles after min to mod verbal, visual, tactile cues. Person educated: Patient Education method: Explanation Education comprehension: verbalized understanding   HOME EXERCISE PROGRAM: Access Code: [email or web address] URL: [email or web address] Date: 01/04/2022 Prepared by: Roxana Hires  Exercises - Sit to Stand with Armchair  - 1 x daily - 3 x weekly - 2 sets - 12 reps - Seated Heel Toe Raises  - 1 x daily - 3 x weekly - 2 sets - 12 reps - Standing Hip Abduction with Counter Support  - 1 x daily - 3 x weekly - 2 sets - 12 reps - Mini Squat with Counter Support  - 1 x daily - 7 x weekly - 2 sets - 12 reps - Standing March with Counter Support  - 1 x daily - 7 x  weekly - 2 sets - 12 reps - Single Leg Stance with Support  - 1 x daily - 7 x weekly - 3 x 30s on each leg hold   ASSESSMENT:  CLINICAL IMPRESSION: Updated outcome measures with patient today. His BERG improved from 44/56 to 46/56 and his TUG decreased from 20.7 to 18.2s. His 41mgait speed increased from 0.73 m/s to 0.81 m/s and his FOTO increased from 64 to 71. He is still unable to perform a sit to stand from a regular height chair without heavy UE assist. Patient's most significant limitation is his RLE weakness as well as severe R knee valgus and R ankle deficits. Continued strengthening again today and updated HEP. Pt has not been performing his HEP. He is ready to discharge on this date.   REHAB POTENTIAL: Fair    CLINICAL DECISION MAKING: Unstable/unpredictable  EVALUATION COMPLEXITY: High   GOALS: Goals reviewed with patient? No  SHORT TERM GOALS: Target date: 12/28/2021  Pt will be independent with HEP in order to improve strength and balance in order to decrease fall risk and improve function at home. Baseline:  Goal status: NOT MET   LONG TERM GOALS: Target date: 02/08/2022  Pt will increase FOTO to at least 65 to demonstrate significant improvement in function at home related to balance  Baseline: 11/16/21: 64; 01/03/22: 71 Goal status: ACHIEVED  2.  Pt will improve BERG by at least 3 points in order to demonstrate clinically significant improvement in balance.   Baseline: 11/16/21: 44/56; 01/03/22: 46/56 Goal status: PARTIALLY MET  3.  Pt  will increase self-selected 10MWT by at least 0.13 m/s in order to demonstrate clinically significant improvement in community ambulation.       Baseline: Self-selected: 15.1s = 0.66 m/s; Self-selected: 12.4s = 0.26ms; Goal status: ACHIEVED  4. Pt will be able to perform sit to stand without UE assistance in order to demonstrate clinically significant improvement in LE strength and decrease in fall risk  Baseline: 11/16/21:  Unable to perform sit to stand without heavy UE assist; 01/03/22: Unable to perform sit to stand without heavy UE assist Goal status: NOT MET  5. Pt will decrease TUG to below 14 seconds/decrease in order to demonstrate decreased fall risk.  Baseline: 11/16/21: 20.7s; 01/03/22: 18.2s Goal status: PARTIALLY MET   PLAN: PT FREQUENCY: 1-2x/week  PT DURATION: 12 weeks  PLANNED INTERVENTIONS: Therapeutic exercises, Therapeutic activity, Neuromuscular re-education, Balance training, Gait training, Patient/Family education, Joint manipulation, Joint mobilization, Canalith repositioning, Aquatic Therapy, Dry Needling, Cognitive remediation, Electrical stimulation, Spinal manipulation, Spinal mobilization, Cryotherapy, Moist heat, Traction, Ultrasound, Ionotophoresis 427mml Dexamethasone, and Manual therapy  PLAN FOR NEXT SESSION: Discharge   JaPhillips GroutT, DPT, GCS  [facility]0/19/2023, 8:19 AM

## 2022-01-03 ENCOUNTER — Ambulatory Visit: Payer: Medicare Other

## 2022-01-03 DIAGNOSIS — M6281 Muscle weakness (generalized): Secondary | ICD-10-CM

## 2022-01-03 DIAGNOSIS — R2681 Unsteadiness on feet: Secondary | ICD-10-CM

## 2023-12-17 NOTE — Progress Notes (Addendum)
 [facility] GI MEDICAL ONCOLOGY CLINIC NOTE  Encounter Date: 12/18/2023  Referring Physician:  Arloa Renate Anthony, Fnp [address] [place],  [place] [address]  PCP:  Freddrick, Information Unavailable  Consulting Providers: Dr. Thalia Abe [facility] Surgical Oncology ____________________________________________________________________ Oncology History:  Diagnosis: L colon adenocarcinoma Current Goal of Therapy: palliative  - 11/03/23 admitted with acute abdominal pain, SOB found on CT to have a large bowel obstruction due to a descending colonic tumor with extramural extension, peritoneal carcinomatosis, R hepatic lobe lesion, and R lung base lesion, possible avascular necrosis in b/l femoral heads, pneumoperitoneum, malignant ascites. - 11/03/23 -12/06/23 prolonged hospital course complicated by cecal perforation, septic shock due to E coli and enterobacter bacteremia and PsA and mixed GP/GN peritonitis, AKI, PE, ICU stay with prolonged mechanical ventilation and need for TPN. On 11/03/23 he went to OR for emergent ex lap where a cecal perforation with feculent peritonitis was found, obstructing colon mass and diffuse carcinomatosis s/p R hemicolectomy with abdomen left in discontinuity due to severe contamination and instability. R liver biopsy, R diaphragm excision showed moderately to poorly differentiated adenocarcinoma with focal mucinous features. R hemi colectomy was negative for cancer with 0/21 LNs positive. 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 male who presented for evaluation and recommendations regarding  colon adenocarcinoma.  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 We discussed that our goals of care are palliative in nature, hoping to increase longevity and QOL but not curative. Also discussed that based on biopsy he has dMMR tumor which could indicate Lynch Syndrome so recommend genetics referral. Will also send Tempus xF to confirm MSI status and evaluate for other actionable mutations.   In regard to treatment, discussed that mainstay of therapy will be systemic given no surgical or RT options. In regard to systemic therapy, standard of care for dMMR tumors is first line immunotherapy. As he has been diverted and not in visceral crisis, reasonable to lead with IO knowing responses take longer. We went over first line data for ipilimumab/nivolumab per [employer] [facility] where 707 patients with dMMR CRC (403 therapy naive) were randomized to ipi/nivo vs nivo vs chemo +/- biologic and and 47 months median follow-up, ipi/nivo improved PFS  54 vs 18 months (HR 0.64), ORR 71 vs 58%. Compared to chemotherapy, it also improved PFS not-reached vs 6 months, OS. These benefits were irrespective of tumor sidedness, BRAF status, PD-L1 status and is current standard of care. Also went over data from [employer] for pembrolizumab in first line. We went over side effects of ipi/nivo including fatigue, rash, diarrhea, thyroid issues, hepatitis, pneumonitis, myocarditis, CNS issues, endocrinopathies which are often irreversible, other organ toxicity and rarely death. Rates of G3 toxicity were 22 vs 14% for ipi/nivo compared to nivo.   I discussed that given his limited PS (which I suspect is mostly disease/recent hospitalization related), hopeful that with time he may be a candidate for ipi/nivo, however, for now I would recommend pembrolizumab as a better balance of  toxicity and benefit. After informed discussion, he wanted to move forward  with therapy.  - port placement due to poor venous access - referral to genetics  - send Tempus xF - start pembrolizumab q3w  - send CEA today as baseline - repeat CT CAP prior to starting therapy for baseline (as last was 11/10/23) - at progression transition to chemo vs ipi/nivo  Acute PE: noted to have PE on 11/10/23 CT and started on heparin, argatroban, then lovenox then apixaban on which he remains. Risk factors: a) metastatic cancer b) surgery c) prolonged hospital stay. - continue apixaban 5mg  bid indefinitely  Normocytic Anemia: Hgb 7.7 12/06/23 suspect due to GI losses - send iron panel today which was indicative of IDA, will order iron and discuss at first visit   Pressure Sore: seems to be healing but still has a deep pocket.  - wound care at SNF  Supportive Care: High Ileostomy output:  - continue Imodium 4mg  qid  - continue Lomotil 1 tablet qid  - continue Urea 15g daily, stop of Na is more than 140  Follow-Up: 10/22 for first pembro, scans, labs  11/12 for labs, Sorah (video 11/11), infusion 12/3 for labs, Tonnie, infusion   This note was created with dictation software. Please excuse any transcription errors.  _____________________________________________________________________ History: Shane [name] is a 73 y.o. male who presented as a new patient in consultation at the request of Arloa, Julienne Sewell* regarding L-colon adenocarcinoma.  History of Present Illness   Shane [name] is a 73 year old male with metastatic colon cancer who presents to establish care. He is accompanied by his daughter, Calton.  He has metastatic colon cancer, initially presenting with bowel obstruction and perforation, leading to ICU admission and surgical intervention with prolonged hospital stay lasting a month. He underwent at least one surgery to address the bowel obstruction and perforation, resulting  in the creation of an ileostomy. He has experienced significant weight loss from 180-190 pounds to 140 pounds since his hospital stay.  Prior to his hospitalization, he was independent of all ADLs and IADLs and lives alone.  More recently, he is planning to tentatively move in with his daughter in [place].  He currently relies in a skilled nursing facility.  He reports pain primarily in his buttocks. He reports high ostomy output and is on medications including Lomotil and Imodium to manage this. He is currently in a rehab facility and participating in physical therapy.  He in general is feeling much better and feels he is moving in the right direction, however, he is still quite weak.  No fevers, chills, or breathing problems. He denies swelling in his belly or legs, and numbness or tingling in his fingers or toes.      Past Medical History: COPD HTN  Family History: Sister: breast cancer, dx in 51s   Social History: Lives in [place], [place] but is going to be moved to [place] to live with his daughter. Has a son as well. Used to work in a lot of different places, now retired. Smoked 1PPD bu quit when hospitalized. Enjoys fishing, hunting, Photographer.   Review of Systems: A ten-point review of systems was conducted and negative unless stated in HPI.  Physical Exam: VITALS: BP 99/83   Pulse 107   Temp 36.5 C (97.7 F) (Temporal)   Resp 16   SpO2 100%  CONSTITUTIONAL: in NAD, appears stated age, able to stand with assistance, in wheelchair HEENT: MMM, no oral lesions or exudates, neck supple CARDIO: normal rate, regular rhythm, no murmurs RESPIRATORY: CTAB  b/l, normal WOB on RA, no conversational dyspnea GI: soft, NT, ND, brown ileostomy output with a small parastomal hernia, has a mucous fistula which is healing nicely EXTREMITIES: no LE edema SKIN: has a pressure ulcer on his coccyx that is healing well, no visible bone.  NEUROLOGIC: alert and oriented, no gross focal deficits  noted  LYMPH: no supraclavicular, axillary, or cervical LAD PSYCH: appropriate mood and affect, good insight and judgment   Labs: Reviewed In Epic 12/06/23 WBC 16.5 Hgb 7.7 Plt 1041 Na 133 Cr 0.27  Imaging: I personally reviewed the following imaging studies and my interpretation is: CT CAP 10/2023

## 2023-12-18 NOTE — Progress Notes (Signed)
 Met with Shane [name] and his daughter, Gerardine, after clinic visit with Dr. Preston. Introduced self and role of NN. Provided contact information and new GI patient handbook, including resources available.   Called and spoke to Kasandra to follow up on scheduling. She has just started a new job. Her fiance will be helping with transportation until she can coordinate future transportation for her father.   Wednesday, 10/22: Lab + Provider + Infusion   Try to coordinate port placement and CT scan on same day. One of the following:   Wednesday, 10/8 Thursday, 10/9  Monday, 10/13 Tuesday, 10/14

## 2023-12-19 NOTE — Telephone Encounter (Signed)
 I spoke with patient Shane [name] to confirm appointments on the following date(s):   10/8 10/22 11/11  Patient asked to call me back for more details when she has a break from work. Provided call back number.  Carly JONELLE Mayor

## 2023-12-20 NOTE — Telephone Encounter (Signed)
 Patient's daughter has informed me that the patient is currently staying in a nursing home - [facility] in [place] (Phone [phone]). They will transport him to his medical appointments until he is discharged.   I called to confirm patient's upcoming appointments scheduled after his new patient visit with Dr. Preston. Transferred to voicemail for Lorenza Baptist, Unit Manager.   Left message with patient's name and DOB and asked her to return my call to confirm appointments and transportation availability.  Carly Levonne

## 2023-12-23 NOTE — Telephone Encounter (Addendum)
 Left another voicemail for Shane [name] at [facility] in [place].  Provided phone number to call back and confirm appointments and transportation.   Carly Levonne

## 2023-12-23 NOTE — Telephone Encounter (Signed)
 Lorenza returned my call and confirmed all appointments and transportation availability.   Carly Levonne

## 2023-12-23 NOTE — Telephone Encounter (Signed)
 VIR pre procedure prep call VM left. Reviewed to arrive 1 hour prior to appointment time.  NPO guidelines reviewed for no food after midnight the night before the procedure. . Needs for driver >75 years of age. Pt aware of our address.

## 2023-12-24 ENCOUNTER — Other Ambulatory Visit: Payer: Self-pay

## 2023-12-24 ENCOUNTER — Emergency Department
Admission: EM | Admit: 2023-12-24 | Discharge: 2023-12-24 | Disposition: A | Discharge status: Skilled Nursing Facility | Attending: Emergency Medicine | Admitting: Emergency Medicine

## 2023-12-24 DIAGNOSIS — D649 Anemia, unspecified: Secondary | ICD-10-CM | POA: Diagnosis not present

## 2023-12-24 DIAGNOSIS — Z85038 Personal history of other malignant neoplasm of large intestine: Secondary | ICD-10-CM | POA: Insufficient documentation

## 2023-12-24 DIAGNOSIS — Z7901 Long term (current) use of anticoagulants: Secondary | ICD-10-CM | POA: Diagnosis not present

## 2023-12-24 DIAGNOSIS — Z86711 Personal history of pulmonary embolism: Secondary | ICD-10-CM | POA: Diagnosis not present

## 2023-12-24 DIAGNOSIS — R799 Abnormal finding of blood chemistry, unspecified: Secondary | ICD-10-CM | POA: Diagnosis present

## 2023-12-24 LAB — BASIC METABOLIC PANEL WITH GFR
Anion gap: 13 (ref 5–15)
BUN: 11 mg/dL (ref 8–23)
CO2: 23 mmol/L (ref 22–32)
Calcium: 8.6 mg/dL — ABNORMAL LOW (ref 8.9–10.3)
Chloride: 98 mmol/L (ref 98–111)
Creatinine, Ser: 0.5 mg/dL — ABNORMAL LOW (ref 0.61–1.24)
GFR, Estimated: >60 mL/min (ref >60)
Glucose, Bld: 114 mg/dL — ABNORMAL HIGH (ref 70–99)
Potassium: 3.7 mmol/L (ref 3.5–5.1)
Sodium: 134 mmol/L — ABNORMAL LOW (ref 135–145)

## 2023-12-24 LAB — CBC WITH DIFFERENTIAL/PLATELET
Abs Immature Granulocytes: 0.07 K/uL (ref 0.00–0.07)
Basophils Absolute: 0 K/uL (ref 0.0–0.1)
Basophils Relative: 0 %
Eosinophils Absolute: 0.3 K/uL (ref 0.0–0.5)
Eosinophils Relative: 2 %
HCT: 19.5 % — ABNORMAL LOW (ref 39.0–52.0)
Hemoglobin: 6.2 g/dL — ABNORMAL LOW (ref 13.0–17.0)
Immature Granulocytes: 1 %
Lymphocytes Relative: 15 %
Lymphs Abs: 2.1 K/uL (ref 0.7–4.0)
MCH: 25.6 pg — ABNORMAL LOW (ref 26.0–34.0)
MCHC: 31.8 g/dL (ref 30.0–36.0)
MCV: 80.6 fL (ref 80.0–100.0)
Monocytes Absolute: 1.5 K/uL — ABNORMAL HIGH (ref 0.1–1.0)
Monocytes Relative: 11 %
Neutro Abs: 9.9 K/uL — ABNORMAL HIGH (ref 1.7–7.7)
Neutrophils Relative %: 71 %
Platelets: 1491 K/uL (ref 150–400)
RBC: 2.42 MIL/uL — ABNORMAL LOW (ref 4.22–5.81)
RDW: 17.1 % — ABNORMAL HIGH (ref 11.5–15.5)
WBC: 13.8 K/uL — ABNORMAL HIGH (ref 4.0–10.5)
nRBC: 0 % (ref 0.0–0.2)

## 2023-12-24 LAB — HEMOGLOBIN AND HEMATOCRIT, BLOOD
HCT: 24.8 % — ABNORMAL LOW (ref 39.0–52.0)
Hemoglobin: 8.1 g/dL — ABNORMAL LOW (ref 13.0–17.0)

## 2023-12-24 LAB — ABO/RH: ABO/RH(D): A POS

## 2023-12-24 LAB — PREPARE RBC (CROSSMATCH)

## 2023-12-24 MED ORDER — SODIUM CHLORIDE 0.9% IV SOLUTION
Freq: Once | INTRAVENOUS | Status: AC
  Filled 2023-12-24: qty 250

## 2023-12-24 NOTE — ED Notes (Signed)
 Updated daughter, Nena, about plan of care. Daughter requesting H&H. MD notified and order placed.

## 2023-12-24 NOTE — ED Notes (Signed)
 [facility] called transport to [facility] , spoke with ED

## 2023-12-24 NOTE — ED Triage Notes (Signed)
 Pt arrived from [facility] via [facility] w/ CC of low hemoglobin and dark stools.

## 2023-12-24 NOTE — ED Provider Notes (Signed)
 [facility] Provider Note    Event Date/Time   First MD Initiated Contact with Patient 12/24/23 [phone]     (approximate)   History   abnormal labs   HPI  Shane [name] is a 73 y.o. 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  Reportedly in the sixes.  He otherwise is in his normal state of health.  He denies any abdominal pain, chest pain, shortness of breath, fatigue, or any other acute medical complaints.  His stoma output has been normal and volume and coloration.  He does not drink alcohol.  He has not had no trauma.  Independent Historian contributed to assessment above: EMS gives report as above  External Medical Documents Reviewed: Earlier this month a note from his oncologist      Physical Exam   Triage Vital Signs: ED Triage Vitals  Encounter Vitals Group     BP      Girls Systolic BP Percentile      Girls Diastolic BP Percentile      Boys Systolic BP Percentile      Boys Diastolic BP Percentile      Pulse      Resp      Temp      Temp src      SpO2      Weight      Height      Head Circumference      Peak Flow      Pain Score      Pain Loc      Pain Education      Exclude from Growth Chart     Most recent vital signs: Vitals:   12/24/23 0037  BP: (!) 91/54  Pulse: (!) 105  Resp: 18  Temp: 98.4 F (36.9 C)  SpO2: 100%    General: Awake, no distress.  CV:  Good peripheral perfusion.  Resp:  Normal effort.  Abd:  No distention.  Other:  Chronically ill-appearing gentleman in no acute distress, pleasant, with a stoma in place with dark brown stool, liquid.  Does not appear melanotic and no frank blood.  Soft benign nondistended abdominal exam.  Clear lungs to auscultation bilaterally.   ED Results / Procedures  / Treatments   Labs (all labs ordered are listed, but only abnormal results are displayed) Labs Reviewed  BASIC METABOLIC PANEL WITH GFR - Abnormal; Notable for the following components:      Result Value   Sodium 134 (*)    Glucose, Bld 114 (*)    Creatinine, Ser 0.50 (*)    Calcium 8.6 (*)    All other components within normal limits  CBC WITH DIFFERENTIAL/PLATELET - Abnormal; Notable for the following components:   WBC 13.8 (*)    RBC 2.42 (*)    Hemoglobin 6.2 (*)    HCT 19.5 (*)    MCH 25.6 (*)    RDW 17.1 (*)    Platelets 1,491 (*)    Neutro Abs 9.9 (*)    Monocytes Absolute 1.5 (*)    All other components within normal limits  TYPE AND SCREEN  PREPARE RBC (CROSSMATCH)     I ordered and reviewed the above labs they are notable for hemoglobin of 6.2 with a prior baseline in the  high sevens to low 8   PROCEDURES:  Critical Care performed: Yes, see critical care procedure note(s)  .Critical Care  Performed by: Cyrena Mylar, MD Authorized by: Cyrena Mylar, MD   Critical care provider statement:    Critical care time (minutes):  40   Critical care was time spent personally by me on the following activities:  Development of treatment plan with patient or surrogate, discussions with consultants, evaluation of patient's response to treatment, examination of patient, ordering and review of laboratory studies, ordering and review of radiographic studies, ordering and performing treatments and interventions, pulse oximetry, re-evaluation of patient's condition and review of old charts    MEDICATIONS ORDERED IN ED: Medications  0.9 %  sodium chloride infusion (Manually program via Guardrails IV Fluids) (has no administration in time range)     IMPRESSION / MDM / ASSESSMENT AND PLAN / ED COURSE  I reviewed the triage vital signs and the nursing notes.                                Patient's presentation is most consistent with acute presentation with potential threat  to life or bodily function.  Differential diagnosis includes, but is not limited to, acute blood loss anemia, chronic anemia, anemia of chronic disease, GI bleed   The patient is on the cardiac monitor to evaluate for evidence of arrhythmia and/or significant heart rate changes.  MDM:    From his care facility with reportedly a hemoglobin around 6.5 per EMS report.  Baseline is in the sevens.  Patient otherwise feels well and is at his baseline.  He reports no GI bleeding there is no evidence of frank blood or melena in his stoma.  He has a benign abdominal exam.  Has had no trauma.  He does take a blood thinner however.  It is important that we recheck his levels to see if his level warrants transfusion today.   -- Indeed anemia today with a hemoglobin of 6.2.  Baseline typically in the 7-8 range dating back throughout the summer time, thought by oncologist to be chronic GI losses.  Blood pressure today 90s over 50s but seems to be near baseline as his outpatient appointment with oncology showed a systolic blood pressure in the 90s and a pulse of 107 at that time as well. I think he warrants blood transfusion for the hemoglobin level today.  Will give 2 units.  Otherwise labs unremarkable, chronic unchanged platelet elevation.  I think he can be discharged after getting the blood.         FINAL CLINICAL IMPRESSION(S) / ED DIAGNOSES   Final diagnoses:  Anemia, unspecified type     Rx / DC Orders   ED Discharge Orders     None        Note:  This document was prepared using Dragon voice recognition software and may include unintentional dictation errors.    Cyrena Mylar, MD 12/24/23 [phone]

## 2023-12-24 NOTE — Progress Notes (Signed)
 Attempted to return call to patient's daughter, Shane [name]. Received chat from [facility] scheduling that she had called in and shared that Shane [name] was taken to hospital but likely discharging back to SNF today. Left detailed voicemail. Included contact information for Nurse Line and encouraged call back.   Shane [name] is scheduled for scan + port placement tomorrow - Wednesday, 10/8. I also shared that I wanted to follow up on looking into resources for transportation and at home assistance with ADLs.

## 2023-12-24 NOTE — ED Notes (Signed)
 Called blood bank to retrieve blood but sts not ready.

## 2023-12-24 NOTE — ED Provider Notes (Addendum)
 Patient doing well, transfusion is completed per nursing.  Will obtain posttransfusion hemoglobin.  Appropriate rise in hemoglobin.  Patient ready for discharge  I discussed with the patient his improvement in his blood count to just slightly over 8.  He is awake and alert has no complaints or concerns.  Plans to return to his nursing facility.  Follow-up with his doctor outpatient   Dicky Anes, MD 12/24/23 9173    Dicky Anes, MD 12/24/23 [phone]

## 2023-12-24 NOTE — Discharge Instructions (Signed)
 You got a blood transfusion today for blood levels that were lower than your normal.  Please follow-up with your oncologist and your primary doctor.  Thank you for choosing us  for your health care today!  Please see your primary doctor this week for a follow up appointment.   If you have any new, worsening, or unexpected symptoms call your doctor right away or come back to the emergency department for reevaluation.  It was my pleasure to care for you today.   Ginnie EDISON Cyrena, MD

## 2023-12-25 LAB — TYPE AND SCREEN
ABO/RH(D): A POS
Antibody Screen: NEGATIVE
Unit division: 0
Unit division: 0

## 2023-12-25 LAB — BPAM RBC
Blood Product Expiration Date: 202510192359
Blood Product Expiration Date: 202510252359
ISSUE DATE / TIME: 202510070248
ISSUE DATE / TIME: 202510070501
Unit Type and Rh: 6200
Unit Type and Rh: 6200

## 2023-12-25 NOTE — H&P (Signed)
 Assessment/Plan:   Shane [name] is a 73 y.o. male who will undergo port placement in Interventional Radiology.  Consent for Operation or Procedure: Provider Certification I hereby certify that the nature, purpose, benefits, usual and most frequent risks of, and alternatives to, the operation or procedure have been explained to the patient (or person authorized to sign for the patient) either by a physician or by the provider who is to perform the operation or procedure, that the patient has had an opportunity to ask questions, and that those questions have been answered. The patient or the patient's representative has been advised that selected tasks may be performed by assistants to the primary health care provider(s). I believe that the patient (or person authorized to sign for the patient) understands what has been explained, and has consented to the operation or procedure.   --The patient will accept blood products in an emergent situation. --The patient does not have a Do Not Resuscitate order in effect.  His code status states no chest compressions.   CC: No chief complaint on file.   HPI: Shane [name] is a 73 y.o. male with colon cancer who will undergo port placement.  Allergies: Allergies[1]  Medications: Current Medications[2]  PMH: Past Medical History[3]  ASA Grade: ASA 3 - Patient with moderate systemic disease with functional limitations  ROS: General: Denies fever or chills. Cardiovascular: Denies chest pain.  Pulmonary: Denies shortness of breath, snoring, sleep apnea, or respiratory infection.  Allergies: Please see allergy section.    PE:   Vitals:   12/25/23 1239  BP: 117/72  Resp: 15  Temp: 37.1 C (98.7 F)  SpO2: 100%   General: WD, WN male in NAD. Lungs: Respirations nonlabored        [1] No Known Allergies [2] Current Outpatient Medications  Medication Sig Dispense Refill  . acetaminophen  (TYLENOL ) 325 MG tablet Take 2 tablets (650 mg total) by  mouth every six (6) hours as needed. 30 tablet 0  . albuterol HFA 90 mcg/actuation inhaler Inhale 2 puffs every four (4) hours as needed for wheezing.    [name] apixaban (ELIQUIS) 5 mg Tab Take 1 tablet (5 mg total) by mouth two (2) times a day. 180 tablet 0  . diphenoxylate-atropine (LOMOTIL) 2.5-0.025 mg per tablet Take 1 tablet by mouth every six (6) hours. 120 tablet 0  . fluticasone propion-salmeterol (WIXELA INHUB) 250-50 mcg/dose diskus Inhale 1 puff two (2) times a day.    . loperamide (IMODIUM) 2 mg capsule Take 2 capsules (4 mg total) by mouth four (4) times a day. 240 capsule 2  . melatonin 3 mg Tab Take 1 tablet (3 mg total) by mouth every evening. 30 tablet 0  . pantoprazole (PROTONIX) 40 MG tablet Take 1 tablet (40 mg total) by mouth daily before breakfast. 90 tablet 0   No current facility-administered medications for this encounter.  [3] No past medical history on file.

## 2023-12-25 NOTE — Unmapped External Note (Signed)
 VIR STATUS POST PORT PLACEMENT   Date/Time: SABRASABRAOctober 8, 2025 1:45 PM   Attending:   Carlin Moles, MD  Assistant(s): None  Diagnosis:  Colon Cancer  Procedure: Port placement.  Time out: Prior to the procedure, a time out was performed with all team members present.  During the time out, the patient, procedure and procedure site when applicable were verbally verified by the team members and Dr. Carlin Moles.    Anesthesia:  Local  Complications: None  Estimated Blood Loss:  Minimal   Specimens:  None  Major Findings: Successful placement right-sided port placement with the tip of the catheter in the proximal right atrium.  Plan:   Catheter ready for use  See detailed procedure note with images in PACS.  The patient tolerated the procedure well without incident or complication and was returned to Recovery area in stable condition.

## 2024-01-01 NOTE — Telephone Encounter (Addendum)
 Copied from CRM [id number]. Topic: Care Management - Discuss Care Plan >> Jan 01, 2024 11:49 AM Gwendlyn FALCON wrote:   Erskin Josette HERO w/ [facility] contacted the Communication Center requesting to speak with the care team of CLIFFORD BENNINGER to discuss:  Requested to speak with a member of the clinical care team. Stated that they wanted to know what was the threshold for the patient to receive a blood transfusion.  Hgb value is 6.9  Please contact Kristen at [phone].  Thank you,  Gwendlyn Janne Coup [facility]  [phone]

## 2024-01-02 NOTE — Progress Notes (Signed)
 See previous notew

## 2024-01-02 NOTE — Progress Notes (Signed)
 Outpatient Oncology Social Work Follow Up    Psychosocial & Case Management Update: SW was asked to send the following information to the patient's daughter, Kasandra.   Patient's insurance contact information is as follows:  [employer]   [phone]  His Medicare ID is: [id number]  *To check on his Medicaid, you will have to call either/both the county he is currently in and/or the county he is going to.   SW will email her a copy of the Advance Directives.    Monica H. Trudy, MSW, LCSW Oncology Outpatient Social Worker [phone]

## 2024-01-04 ENCOUNTER — Emergency Department

## 2024-01-04 ENCOUNTER — Inpatient Hospital Stay
Admission: EM | Admit: 2024-01-04 | Discharge: 2024-01-11 | DRG: 811 | Disposition: A | Discharge status: Skilled Nursing Facility | Source: Skilled Nursing Facility | Attending: Internal Medicine | Admitting: Internal Medicine

## 2024-01-04 ENCOUNTER — Other Ambulatory Visit: Payer: Self-pay

## 2024-01-04 DIAGNOSIS — D6832 Hemorrhagic disorder due to extrinsic circulating anticoagulants: Secondary | ICD-10-CM | POA: Diagnosis present

## 2024-01-04 DIAGNOSIS — Z1152 Encounter for screening for COVID-19: Secondary | ICD-10-CM | POA: Diagnosis not present

## 2024-01-04 DIAGNOSIS — D62 Acute posthemorrhagic anemia: Secondary | ICD-10-CM | POA: Diagnosis present

## 2024-01-04 DIAGNOSIS — L89154 Pressure ulcer of sacral region, stage 4: Secondary | ICD-10-CM | POA: Diagnosis present

## 2024-01-04 DIAGNOSIS — Z79899 Other long term (current) drug therapy: Secondary | ICD-10-CM

## 2024-01-04 DIAGNOSIS — D509 Iron deficiency anemia, unspecified: Secondary | ICD-10-CM | POA: Diagnosis present

## 2024-01-04 DIAGNOSIS — F17200 Nicotine dependence, unspecified, uncomplicated: Secondary | ICD-10-CM | POA: Diagnosis present

## 2024-01-04 DIAGNOSIS — J9811 Atelectasis: Secondary | ICD-10-CM | POA: Diagnosis present

## 2024-01-04 DIAGNOSIS — T45515A Adverse effect of anticoagulants, initial encounter: Secondary | ICD-10-CM | POA: Diagnosis present

## 2024-01-04 DIAGNOSIS — D649 Anemia, unspecified: Secondary | ICD-10-CM | POA: Diagnosis not present

## 2024-01-04 DIAGNOSIS — K219 Gastro-esophageal reflux disease without esophagitis: Secondary | ICD-10-CM | POA: Diagnosis present

## 2024-01-04 DIAGNOSIS — E871 Hypo-osmolality and hyponatremia: Secondary | ICD-10-CM | POA: Diagnosis present

## 2024-01-04 DIAGNOSIS — Z681 Body mass index (BMI) 19 or less, adult: Secondary | ICD-10-CM | POA: Diagnosis not present

## 2024-01-04 DIAGNOSIS — L8990 Pressure ulcer of unspecified site, unspecified stage: Secondary | ICD-10-CM | POA: Diagnosis present

## 2024-01-04 DIAGNOSIS — J449 Chronic obstructive pulmonary disease, unspecified: Secondary | ICD-10-CM | POA: Diagnosis present

## 2024-01-04 DIAGNOSIS — C787 Secondary malignant neoplasm of liver and intrahepatic bile duct: Secondary | ICD-10-CM | POA: Diagnosis present

## 2024-01-04 DIAGNOSIS — C7800 Secondary malignant neoplasm of unspecified lung: Secondary | ICD-10-CM | POA: Diagnosis present

## 2024-01-04 DIAGNOSIS — N390 Urinary tract infection, site not specified: Secondary | ICD-10-CM | POA: Diagnosis present

## 2024-01-04 DIAGNOSIS — D75839 Thrombocytosis, unspecified: Secondary | ICD-10-CM | POA: Diagnosis present

## 2024-01-04 DIAGNOSIS — E43 Unspecified severe protein-calorie malnutrition: Secondary | ICD-10-CM | POA: Diagnosis present

## 2024-01-04 DIAGNOSIS — K921 Melena: Secondary | ICD-10-CM | POA: Diagnosis present

## 2024-01-04 DIAGNOSIS — R64 Cachexia: Secondary | ICD-10-CM | POA: Diagnosis present

## 2024-01-04 DIAGNOSIS — F17210 Nicotine dependence, cigarettes, uncomplicated: Secondary | ICD-10-CM | POA: Diagnosis present

## 2024-01-04 DIAGNOSIS — E860 Dehydration: Secondary | ICD-10-CM | POA: Diagnosis present

## 2024-01-04 DIAGNOSIS — Z833 Family history of diabetes mellitus: Secondary | ICD-10-CM

## 2024-01-04 DIAGNOSIS — Z7901 Long term (current) use of anticoagulants: Secondary | ICD-10-CM

## 2024-01-04 DIAGNOSIS — Z9049 Acquired absence of other specified parts of digestive tract: Secondary | ICD-10-CM

## 2024-01-04 DIAGNOSIS — Z66 Do not resuscitate: Secondary | ICD-10-CM | POA: Diagnosis present

## 2024-01-04 DIAGNOSIS — N3000 Acute cystitis without hematuria: Secondary | ICD-10-CM

## 2024-01-04 DIAGNOSIS — E46 Unspecified protein-calorie malnutrition: Secondary | ICD-10-CM | POA: Diagnosis present

## 2024-01-04 DIAGNOSIS — Z932 Ileostomy status: Secondary | ICD-10-CM

## 2024-01-04 DIAGNOSIS — I82409 Acute embolism and thrombosis of unspecified deep veins of unspecified lower extremity: Secondary | ICD-10-CM | POA: Diagnosis present

## 2024-01-04 DIAGNOSIS — C189 Malignant neoplasm of colon, unspecified: Secondary | ICD-10-CM | POA: Diagnosis present

## 2024-01-04 DIAGNOSIS — I2699 Other pulmonary embolism without acute cor pulmonale: Secondary | ICD-10-CM | POA: Diagnosis not present

## 2024-01-04 DIAGNOSIS — J90 Pleural effusion, not elsewhere classified: Secondary | ICD-10-CM | POA: Diagnosis present

## 2024-01-04 DIAGNOSIS — Z86711 Personal history of pulmonary embolism: Secondary | ICD-10-CM

## 2024-01-04 DIAGNOSIS — K922 Gastrointestinal hemorrhage, unspecified: Secondary | ICD-10-CM | POA: Diagnosis present

## 2024-01-04 DIAGNOSIS — Z8249 Family history of ischemic heart disease and other diseases of the circulatory system: Secondary | ICD-10-CM

## 2024-01-04 DIAGNOSIS — Z86718 Personal history of other venous thrombosis and embolism: Secondary | ICD-10-CM

## 2024-01-04 HISTORY — DX: Gastro-esophageal reflux disease without esophagitis: K21.9

## 2024-01-04 HISTORY — DX: Iron deficiency anemia, unspecified: D50.9

## 2024-01-04 HISTORY — DX: Other pulmonary embolism without acute cor pulmonale: I26.99

## 2024-01-04 HISTORY — DX: Nicotine dependence, unspecified, uncomplicated: F17.200

## 2024-01-04 HISTORY — DX: Malignant neoplasm of colon, unspecified: C18.9

## 2024-01-04 LAB — COMPREHENSIVE METABOLIC PANEL WITH GFR
ALT: 9 U/L (ref 0–44)
AST: 22 U/L (ref 15–41)
Albumin: 2.3 g/dL — ABNORMAL LOW (ref 3.5–5.0)
Alkaline Phosphatase: 85 U/L (ref 38–126)
Anion gap: 10 (ref 5–15)
BUN: 13 mg/dL (ref 8–23)
CO2: 22 mmol/L (ref 22–32)
Calcium: 8.6 mg/dL — ABNORMAL LOW (ref 8.9–10.3)
Chloride: 97 mmol/L — ABNORMAL LOW (ref 98–111)
Creatinine, Ser: 0.67 mg/dL (ref 0.61–1.24)
GFR, Estimated: >60 mL/min (ref >60)
Glucose, Bld: 100 mg/dL — ABNORMAL HIGH (ref 70–99)
Potassium: 4.8 mmol/L (ref 3.5–5.1)
Sodium: 129 mmol/L — ABNORMAL LOW (ref 135–145)
Total Bilirubin: 0.8 mg/dL (ref 0.0–1.2)
Total Protein: 7.5 g/dL (ref 6.5–8.1)

## 2024-01-04 LAB — CBC WITH DIFFERENTIAL/PLATELET
Abs Immature Granulocytes: 0.11 K/uL — ABNORMAL HIGH (ref 0.00–0.07)
Basophils Absolute: 0.1 K/uL (ref 0.0–0.1)
Basophils Relative: 0 %
Eosinophils Absolute: 0.1 K/uL (ref 0.0–0.5)
Eosinophils Relative: 0 %
HCT: 18.8 % — ABNORMAL LOW (ref 39.0–52.0)
Hemoglobin: 5.9 g/dL — ABNORMAL LOW (ref 13.0–17.0)
Immature Granulocytes: 1 %
Lymphocytes Relative: 9 %
Lymphs Abs: 1.8 K/uL (ref 0.7–4.0)
MCH: 25.8 pg — ABNORMAL LOW (ref 26.0–34.0)
MCHC: 31.4 g/dL (ref 30.0–36.0)
MCV: 82.1 fL (ref 80.0–100.0)
Monocytes Absolute: 1 K/uL (ref 0.1–1.0)
Monocytes Relative: 5 %
Neutro Abs: 17.1 K/uL — ABNORMAL HIGH (ref 1.7–7.7)
Neutrophils Relative %: 85 %
Platelets: 1405 K/uL (ref 150–400)
RBC: 2.29 MIL/uL — ABNORMAL LOW (ref 4.22–5.81)
RDW: 17.7 % — ABNORMAL HIGH (ref 11.5–15.5)
WBC: 20.1 K/uL — ABNORMAL HIGH (ref 4.0–10.5)
nRBC: 0 % (ref 0.0–0.2)

## 2024-01-04 LAB — TROPONIN I (HIGH SENSITIVITY): Troponin I (High Sensitivity): 8 ng/L (ref <18)

## 2024-01-04 LAB — RESP PANEL BY RT-PCR (RSV, FLU A&B, COVID)  RVPGX2
Influenza A by PCR: NEGATIVE
Influenza B by PCR: NEGATIVE
Resp Syncytial Virus by PCR: NEGATIVE
SARS Coronavirus 2 by RT PCR: NEGATIVE

## 2024-01-04 LAB — URINALYSIS, ROUTINE W REFLEX MICROSCOPIC
Bilirubin Urine: NEGATIVE
Glucose, UA: NEGATIVE mg/dL
Hgb urine dipstick: NEGATIVE
Ketones, ur: NEGATIVE mg/dL
Nitrite: POSITIVE — AB
Protein, ur: 30 mg/dL — AB
Specific Gravity, Urine: 1.015 (ref 1.005–1.030)
WBC, UA: >50 WBC/hpf (ref 0–5)
pH: 5 (ref 5.0–8.0)

## 2024-01-04 LAB — SODIUM, URINE, RANDOM: Sodium, Ur: 90 mmol/L

## 2024-01-04 LAB — PREPARE RBC (CROSSMATCH)

## 2024-01-04 LAB — OSMOLALITY, URINE: Osmolality, Ur: 563 mosm/kg (ref 300–900)

## 2024-01-04 MED ORDER — SODIUM CHLORIDE 1 G PO TABS
1.0000 g | ORAL_TABLET | Freq: Two times a day (BID) | ORAL | Status: DC
  Administered 2024-01-04 – 2024-01-11 (×14): 1 g via ORAL
  Filled 2024-01-04 (×14): qty 1

## 2024-01-04 MED ORDER — MELATONIN 5 MG PO TABS
2.5000 mg | ORAL_TABLET | Freq: Every day | ORAL | Status: DC
  Administered 2024-01-04 – 2024-01-10 (×7): 2.5 mg via ORAL
  Filled 2024-01-04 (×7): qty 1

## 2024-01-04 MED ORDER — NICOTINE 21 MG/24HR TD PT24
21.0000 mg | MEDICATED_PATCH | Freq: Every day | TRANSDERMAL | Status: DC
  Administered 2024-01-04 – 2024-01-11 (×8): 21 mg via TRANSDERMAL
  Filled 2024-01-04 (×8): qty 1

## 2024-01-04 MED ORDER — SODIUM CHLORIDE 0.9 % IV BOLUS
1000.0000 mL | Freq: Once | INTRAVENOUS | Status: AC
  Administered 2024-01-04: 1000 mL via INTRAVENOUS

## 2024-01-04 MED ORDER — SODIUM CHLORIDE 0.9 % IV SOLN
1.0000 g | INTRAVENOUS | Status: DC
  Administered 2024-01-05 – 2024-01-06 (×2): 1 g via INTRAVENOUS
  Filled 2024-01-04 (×4): qty 10

## 2024-01-04 MED ORDER — DM-GUAIFENESIN ER 30-600 MG PO TB12: 1.0000 | ORAL_TABLET | Freq: Two times a day (BID) | ORAL | Status: DC | PRN

## 2024-01-04 MED ORDER — ALUM & MAG HYDROXIDE-SIMETH 200-200-20 MG/5ML PO SUSP: 30.0000 mL | ORAL | Status: DC | PRN

## 2024-01-04 MED ORDER — ALBUTEROL SULFATE (2.5 MG/3ML) 0.083% IN NEBU: 2.5000 mg | INHALATION_SOLUTION | RESPIRATORY_TRACT | Status: DC | PRN

## 2024-01-04 MED ORDER — FERROUS SULFATE 325 (65 FE) MG PO TABS
325.0000 mg | ORAL_TABLET | Freq: Two times a day (BID) | ORAL | Status: DC
Start: 2024-01-04 — End: 2024-01-11
  Administered 2024-01-04 – 2024-01-11 (×14): 325 mg via ORAL
  Filled 2024-01-04 (×14): qty 1

## 2024-01-04 MED ORDER — ACETAMINOPHEN 325 MG PO TABS
650.0000 mg | ORAL_TABLET | Freq: Four times a day (QID) | ORAL | Status: DC | PRN
  Administered 2024-01-04 – 2024-01-07 (×3): 650 mg via ORAL
  Filled 2024-01-04 (×4): qty 2

## 2024-01-04 MED ORDER — LOPERAMIDE HCL 2 MG PO CAPS
2.0000 mg | ORAL_CAPSULE | Freq: Four times a day (QID) | ORAL | Status: DC
Start: 2024-01-04 — End: 2024-01-11
  Administered 2024-01-04 – 2024-01-11 (×25): 2 mg via ORAL
  Filled 2024-01-04 (×25): qty 1

## 2024-01-04 MED ORDER — ONDANSETRON HCL 4 MG/2ML IJ SOLN: 4.0000 mg | Freq: Three times a day (TID) | INTRAMUSCULAR | Status: DC | PRN

## 2024-01-04 MED ORDER — CHLORHEXIDINE GLUCONATE CLOTH 2 % EX PADS
6.0000 | MEDICATED_PAD | Freq: Every day | CUTANEOUS | Status: DC
  Administered 2024-01-05 – 2024-01-09 (×5): 6 via TOPICAL

## 2024-01-04 MED ORDER — SODIUM CHLORIDE 0.9 % IV SOLN: INTRAVENOUS | Status: DC

## 2024-01-04 MED ORDER — VITAMIN C 500 MG PO TABS
500.0000 mg | ORAL_TABLET | Freq: Every day | ORAL | Status: DC
Start: 2024-01-05 — End: 2024-01-06
  Administered 2024-01-05 – 2024-01-06 (×2): 500 mg via ORAL
  Filled 2024-01-04 (×2): qty 1

## 2024-01-04 MED ORDER — ENSURE PLUS HIGH PROTEIN PO LIQD
237.0000 mL | Freq: Two times a day (BID) | ORAL | Status: DC
Start: 2024-01-05 — End: 2024-01-06
  Administered 2024-01-05 – 2024-01-06 (×3): 237 mL via ORAL

## 2024-01-04 MED ORDER — PANTOPRAZOLE SODIUM 40 MG IV SOLR
40.0000 mg | Freq: Two times a day (BID) | INTRAVENOUS | Status: DC
  Administered 2024-01-04 – 2024-01-09 (×10): 40 mg via INTRAVENOUS
  Filled 2024-01-04 (×10): qty 10

## 2024-01-04 MED ORDER — SODIUM CHLORIDE 0.9% FLUSH: 10.0000 mL | INTRAVENOUS | Status: DC | PRN

## 2024-01-04 MED ORDER — SODIUM CHLORIDE 0.9 % IV SOLN
10.0000 mL/h | Freq: Once | INTRAVENOUS | Status: AC
  Administered 2024-01-04: 10 mL/h via INTRAVENOUS

## 2024-01-04 MED ORDER — SODIUM CHLORIDE 0.9% FLUSH
10.0000 mL | Freq: Two times a day (BID) | INTRAVENOUS | Status: DC
  Administered 2024-01-05 (×3): 10 mL
  Administered 2024-01-06: 40 mL
  Administered 2024-01-06 – 2024-01-08 (×4): 10 mL
  Administered 2024-01-08: 20 mL
  Administered 2024-01-09 – 2024-01-11 (×5): 10 mL

## 2024-01-04 NOTE — ED Notes (Signed)
 This RN called the floor to advised pt on the way. No answer

## 2024-01-04 NOTE — ED Notes (Signed)
 Signature pad not working in room. Paper consent for blood obtained and placed in black scan box on B side.

## 2024-01-04 NOTE — ED Triage Notes (Signed)
 BIB [facility] from [facility] called out for weakness and dizziness since yesterday. Pt has HX of colon cancer with colostomy bacg. Facility did blood and wednesday HGB was 7.9 100/63 100 HR  Pt is caox4. Pt has known bedsore on back side. Pt also has a port but unknown last access.

## 2024-01-04 NOTE — ED Provider Notes (Signed)
 [facility] Provider Note    Event Date/Time   First MD Initiated Contact with Patient 01/04/24 1635     (approximate)   History   Weakness (X 1 day)   HPI  Shane [name] is a 73 y.o. male with a history of metastatic colon cancer, anemia, PE on apixaban who presents with generalized weakness, acute onset yesterday, associated with lightheadedness as well as shortness of breath.  Patient denies any chest or abdominal pain.  He has no acute bleeding.  Recent labs at his facility showed hemoglobin was 7.9.  I reviewed the past medical records for the patient's most recent outpatient encounter was on 10/8 with IR for port placement.  He was last seen in the ED on 10/7 for low hemoglobin and received a transfusion.   Physical Exam   Triage Vital Signs: ED Triage Vitals [01/04/24 1632]  Encounter Vitals Group     BP      Girls Systolic BP Percentile      Girls Diastolic BP Percentile      Boys Systolic BP Percentile      Boys Diastolic BP Percentile      Pulse      Resp      Temp      Temp src      SpO2      Weight      Height 6' 5 (1.956 m)     Head Circumference      Peak Flow      Pain Score 0     Pain Loc      Pain Education      Exclude from Growth Chart     Most recent vital signs: Vitals:   01/04/24 1915 01/04/24 1930  BP: 104/67 106/65  Pulse: (!) 106 (!) 107  Resp: (!) 31 (!) 30  Temp:    SpO2: 100% 100%     General: Alert and oriented, no distress.  CV:  Good peripheral perfusion.  Resp:  Normal effort.  Lungs CTAB. Abd:  No distention.  Soft and nontender. Other:  No peripheral edema.   ED Results / Procedures / Treatments   Labs (all labs ordered are listed, but only abnormal results are displayed) Labs Reviewed  CBC WITH DIFFERENTIAL/PLATELET - Abnormal; Notable for the following components:      Result Value   WBC 20.1 (*)    RBC 2.29 (*)    Hemoglobin 5.9 (*)    HCT 18.8 (*)    MCH 25.8 (*)    RDW 17.7  (*)    Platelets 1,405 (*)    Neutro Abs 17.1 (*)    Abs Immature Granulocytes 0.11 (*)    All other components within normal limits  COMPREHENSIVE METABOLIC PANEL WITH GFR - Abnormal; Notable for the following components:   Sodium 129 (*)    Chloride 97 (*)    Glucose, Bld 100 (*)    Calcium 8.6 (*)    Albumin 2.3 (*)    All other components within normal limits  URINALYSIS, ROUTINE W REFLEX MICROSCOPIC - Abnormal; Notable for the following components:   Color, Urine YELLOW (*)    APPearance CLOUDY (*)    Protein, ur 30 (*)    Nitrite POSITIVE (*)    Leukocytes,Ua LARGE (*)    Bacteria, UA MANY (*)    All other components within normal limits  RESP PANEL BY RT-PCR (RSV, FLU A&B, COVID)  RVPGX2  CULTURE, BLOOD (ROUTINE X  2)  CULTURE, BLOOD (ROUTINE X 2)  URINE CULTURE  CBC  CBC  CBC  BASIC METABOLIC PANEL WITH GFR  PROTIME-INR  APTT  MAGNESIUM  PHOSPHORUS  OSMOLALITY  OSMOLALITY, URINE  SODIUM, URINE, RANDOM  BASIC METABOLIC PANEL WITH GFR  BASIC METABOLIC PANEL WITH GFR  TYPE AND SCREEN  PREPARE RBC (CROSSMATCH)  TROPONIN I (HIGH SENSITIVITY)     EKG  ED ECG REPORT I, Waylon Cassis, the attending physician, personally viewed and interpreted this ECG.  Date: 01/04/2024 EKG Time: 1635 Rate: 104 Rhythm: Sinus tachycardia QRS Axis: normal Intervals: normal ST/T Wave abnormalities:  Repolarization abnormality Narrative Interpretation: no evidence of acute ischemia    RADIOLOGY  Chest x-ray: I independently viewed and interpreted the images; there is significant right lung opacity.  Radiology report indicates the following:  IMPRESSION:  Volume loss in the right hemithorax with pleural effusion and  opacity in the mid lower lung zone. This may represent atelectasis,  pneumonia, or neoplasm. Recommend correlation with any prior outside  imaging.    PROCEDURES:  Critical Care performed: No  Procedures   MEDICATIONS ORDERED IN ED: Medications   ondansetron (ZOFRAN) injection 4 mg (has no administration in time range)  acetaminophen  (TYLENOL ) tablet 650 mg (has no administration in time range)  albuterol (PROVENTIL) (2.5 MG/3ML) 0.083% nebulizer solution 2.5 mg (has no administration in time range)  dextromethorphan-guaiFENesin (MUCINEX DM) 30-600 MG per 12 hr tablet 1 tablet (has no administration in time range)  cefTRIAXone (ROCEPHIN) 1 g in sodium chloride 0.9 % 100 mL IVPB (0 g Intravenous Paused 01/04/24 1938)  pantoprazole (PROTONIX) injection 40 mg (has no administration in time range)  sodium chloride tablet 1 g (1 g Oral Given 01/04/24 1936)  sodium chloride 0.9 % bolus 1,000 mL (has no administration in time range)  0.9 %  sodium chloride infusion (has no administration in time range)  loperamide (IMODIUM) capsule 2 mg (has no administration in time range)  alum & mag hydroxide-simeth (MAALOX/MYLANTA) 200-200-20 MG/5ML suspension 30 mL (has no administration in time range)  ferrous sulfate tablet 325 mg (has no administration in time range)  melatonin tablet 2.5 mg (has no administration in time range)  ascorbic acid (VITAMIN C) tablet 500 mg (has no administration in time range)  nicotine (NICODERM CQ - dosed in mg/24 hours) patch 21 mg (has no administration in time range)  0.9 %  sodium chloride infusion (10 mL/hr Intravenous New Bag/Given 01/04/24 1903)     IMPRESSION / MDM / ASSESSMENT AND PLAN / ED COURSE  I reviewed the triage vital signs and the nursing notes.  73 year old male with PMH as noted above presents with generalized weakness and lightheadedness as well as shortness of breath since yesterday.  On exam the patient is borderline tachycardic and borderline hypotensive.  Temperature is also slightly elevated.  Physical exam is otherwise unremarkable for acute findings.  Differential diagnosis includes, but is not limited to, recurrent/worsening anemia, dehydration, electrolyte abnormality, other metabolic  cause, pneumonia, COVID, other viral syndrome, acute bronchitis, UTI, less likely cardiac etiology.  We will obtain chest x-ray, lab workup, and reassess.  Patient's presentation is most consistent with acute presentation with potential threat to life or bodily function.  The patient is on the cardiac monitor to evaluate for evidence of arrhythmia and/or significant heart rate changes.  ----------------------------------------- 8:15 PM on 01/04/2024 -----------------------------------------  Hemoglobin is 5.9.  Platelets are elevated but this is chronic for the patient.  CMP shows no acute abnormalities.  Troponin is negative.  I have ordered 2 units of PRBCs.  I consulted Dr. Joylene from the hospitalist service; based on our discussion he agrees to evaluate the patient for admission.   FINAL CLINICAL IMPRESSION(S) / ED DIAGNOSES   Final diagnoses:  Anemia, unspecified type     Rx / DC Orders   ED Discharge Orders     None        Note:  This document was prepared using Dragon voice recognition software and may include unintentional dictation errors.    Jacolyn Pae, MD 01/04/24 2016

## 2024-01-04 NOTE — ED Notes (Signed)
 Paused abx due to IV infiltrated with NS.

## 2024-01-04 NOTE — Progress Notes (Signed)
 The patient is admitted from ED to 2 A  3. He's A & O x 4. He's oriented to his room, ascom and staff. Will complete full assessment to epic.

## 2024-01-04 NOTE — ED Notes (Signed)
 Delay in blood cultures due to pt receiving a blood transfusion per lab

## 2024-01-04 NOTE — H&P (Addendum)
 History and Physical    Shane [name] FMW:[id number] DOB: 10/17/1950 DOA: 01/04/2024  Referring MD/NP/PA:   PCP: Patient, No Pcp Per   Patient coming from:  The patient is coming from SNF  Chief Complaint: Weakness and lightheadedness  HPI: Shane [name] is a 73 y.o. 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  Patient has chronic iron deficiency anemia.  Patient was seen in the ED due to worsening anemia on 10/7, with hemoglobin 6.2.  Patient received 2 unit of blood in that visit.  Patient states that since yesterday he started feeling weaker and lightheaded.  No fall or injury.  He also reports mild SOB and mild cough with little white mucus production.  No chest pain, fever or chills.  Patient denies nausea, vomiting, diarrhea or abdominal pain.  No dark stool or blood noted in ileostomy bag.  No symptoms of UTI.  He has chronic sacral ulcer.  Data reviewed independently and ED Course: pt was found to have hemoglobin 5.9 (8.1 on 12/24/2023), WBC 20.1, platelets 1405, GFR> 60, sodium 129, negative PCR for COVID, flu and RSV, positive UA (cloudy appearance, large amount of leukocyte, positive nitrite, many bacteria, WBC> 50).  Temperature normal, blood pressure 100/69, heart rate of 109, RR 33, oxygen saturation 100% on room air.  Patient is admitted to PCU as inpatient. Epic message is scheduled to be sent to Dr. Maryruth of GI for consult at 6 AM  Chest x-ray: Volume loss in the right hemithorax with pleural effusion and opacity in the mid lower lung zone. This may represent atelectasis, pneumonia, or neoplasm.    EKG: I have personally reviewed.  Sinus rhythm, QTc 461, and stable baseline.   Review of Systems:   General: no fevers, chills, no body weight gain, has poor appetite, has  fatigue HEENT: no blurry vision, hearing changes or sore throat Respiratory: has dyspnea, coughing, no wheezing CV: no chest pain, no palpitations GI: no nausea, vomiting, abdominal pain, diarrhea, constipation. S/p of ileostomy GU: no dysuria, burning on urination, increased urinary frequency, hematuria  Ext: no leg edema Neuro: no unilateral weakness, numbness, or tingling, no vision change or hearing loss. Has dizziness Skin: has sacral ulcer MSK: No muscle spasm, no deformity, no limitation of range of movement in spin Heme: No easy bruising.  Travel history: No recent long distant travel.   Allergy: No Known Allergies  Past Medical History:  Diagnosis Date   Colon cancer (HCC)    GERD (gastroesophageal reflux disease)    Iron deficiency anemia    Knee injury 03/19/2018   Leg injury 12/17/2017   Pulmonary embolism (HCC)    Tobacco use disorder     Past Surgical History:  Procedure Laterality Date   FRACTURE SURGERY Right    foot    Social History:  reports that he has been smoking cigarettes. He has a 21 pack-year smoking history. He has never used smokeless tobacco. He reports current alcohol use of about 6.0 standard drinks of alcohol per week. He reports that he does not use drugs.  Family History:  Family History  Problem Relation Age of Onset   Diabetes Mother    Hypertension Mother    Hypertension Father      Prior to Admission medications   Medication Sig Start Date End Date Taking? Authorizing Provider  Multiple Vitamins-Minerals (ENERGY BOOSTER PO) Take by mouth.    [provider]    Physical Exam: Vitals:   01/04/24 2217 01/04/24 2303 01/05/24 0020 01/05/24 0138  BP: 112/68 (!) 97/59 98/61 121/72  Pulse: (!) 104 100 93 97  Resp: (!) 30 (!) 25 (!) 30 20  Temp: 97.6 F (36.4 C) 99.2 F (37.3 C) (!) 97.3 F (36.3 C)   TempSrc: Oral Oral    SpO2: 100% 98% 100%   Weight:      Height:       General: Not in acute distress.  Cachectic and  looking. HEENT:       Eyes: PERRL, EOMI, no jaundice       ENT: No discharge from the ears and nose, no pharynx injection, no tonsillar enlargement.        Neck: No JVD, no bruit, no mass felt. Heme: No neck lymph node enlargement. Cardiac: S1/S2, RRR, No murmurs, No gallops or rubs. Respiratory: No rales, wheezing, rhonchi or rubs. GI: Soft, nondistended, nontender, no rebound pain, no organomegaly, BS present. S/p of ileostomy. GU: No hematuria Ext: No pitting leg edema bilaterally. 1+DP/PT pulse bilaterally. Musculoskeletal: No joint deformities, No joint redness or warmth, no limitation of ROM in spin. Skin:  his surgical site from recent partial colectomy still has small area nonhealed, without signs of infection.  Patient has stage IV sacral ulcer.       Neuro: Alert, oriented X3, cranial nerves II-XII grossly intact, moves all extremities normally.  Psych: Patient is not psychotic, no suicidal or hemocidal ideation.  Labs on Admission: I have personally reviewed following labs and imaging studies  CBC: Recent Labs  Lab 01/04/24 1636  WBC 20.1*  NEUTROABS 17.1*  HGB 5.9*  HCT 18.8*  MCV 82.1  PLT 1,405*   Basic Metabolic Panel: Recent Labs  Lab 01/04/24 1636  NA 129*  K 4.8  CL 97*  CO2 22  GLUCOSE 100*  BUN 13  CREATININE 0.67  CALCIUM 8.6*   GFR: Estimated Creatinine Clearance: 75.9 mL/min (by C-G formula based on SCr of 0.67 mg/dL). Liver Function Tests: Recent Labs  Lab 01/04/24 1636  AST 22  ALT 9  ALKPHOS 85  BILITOT 0.8  PROT 7.5  ALBUMIN 2.3*   No results for input(s): LIPASE, AMYLASE in the last 168 hours. No results for input(s): AMMONIA in the last 168 hours. Coagulation Profile: No results for input(s): INR, PROTIME in the last 168 hours. Cardiac Enzymes: No results for input(s): CKTOTAL, CKMB, CKMBINDEX, TROPONINI in the last 168 hours. BNP (last 3 results) No results for input(s): PROBNP in the last 8760  hours. HbA1C: No results for input(s): HGBA1C in the last 72 hours. CBG: No results for input(s): GLUCAP in the last 168 hours. Lipid Profile: No results for input(s): CHOL, HDL, LDLCALC, TRIG, CHOLHDL, LDLDIRECT in the last 72 hours. Thyroid Function Tests: No results for input(s): TSH, T4TOTAL, FREET4, T3FREE, THYROIDAB in the last 72 hours. Anemia Panel: No results for input(s): VITAMINB12, FOLATE, FERRITIN, TIBC, IRON, RETICCTPCT in the last 72 hours. Urine analysis:    Component Value Date/Time   COLORURINE YELLOW (A) 01/04/2024 1750   APPEARANCEUR CLOUDY (A) 01/04/2024 1750   LABSPEC 1.015 01/04/2024 1750   PHURINE 5.0 01/04/2024 1750   GLUCOSEU NEGATIVE 01/04/2024 1750   HGBUR NEGATIVE 01/04/2024 1750   BILIRUBINUR NEGATIVE 01/04/2024 1750   KETONESUR NEGATIVE 01/04/2024 1750   PROTEINUR 30 (A) 01/04/2024 1750   NITRITE POSITIVE (A) 01/04/2024 1750  LEUKOCYTESUR LARGE (A) 01/04/2024 1750   Sepsis Labs: @LABRCNTIP (procalcitonin:4,lacticidven:4) ) Recent Results (from the past 240 hours)  Resp panel by RT-PCR (RSV, Flu A&B, Covid) Anterior Nasal Swab     Status: None   Collection Time: 01/04/24  4:49 PM   Specimen: Anterior Nasal Swab  Result Value Ref Range Status   SARS Coronavirus 2 by RT PCR NEGATIVE NEGATIVE Final    Comment: (NOTE) SARS-CoV-2 target nucleic acids are NOT DETECTED.  The SARS-CoV-2 RNA is generally detectable in upper respiratory specimens during the acute phase of infection. The lowest concentration of SARS-CoV-2 viral copies this assay can detect is 138 copies/mL. A negative result does not preclude SARS-Cov-2 infection and should not be used as the sole basis for treatment or other patient management decisions. A negative result may occur with  improper specimen collection/handling, submission of specimen other than nasopharyngeal swab, presence of viral mutation(s) within the areas targeted by this  assay, and inadequate number of viral copies(<138 copies/mL). A negative result must be combined with clinical observations, patient history, and epidemiological information. The expected result is Negative.  Fact Sheet for Patients:  BloggerCourse.com  Fact Sheet for Healthcare Providers:  SeriousBroker.it  This test is no t yet approved or cleared by the [place]  FDA and  has been authorized for detection and/or diagnosis of SARS-CoV-2 by FDA under an Emergency Use Authorization (EUA). This EUA will remain  in effect (meaning this test can be used) for the duration of the COVID-19 declaration under Section 564(b)(1) of the Act, 21 U.S.C.section 360bbb-3(b)(1), unless the authorization is terminated  or revoked sooner.       Influenza A by PCR NEGATIVE NEGATIVE Final   Influenza B by PCR NEGATIVE NEGATIVE Final    Comment: (NOTE) The Xpert Xpress SARS-CoV-2/FLU/RSV plus assay is intended as an aid in the diagnosis of influenza from Nasopharyngeal swab specimens and should not be used as a sole basis for treatment. Nasal washings and aspirates are unacceptable for Xpert Xpress SARS-CoV-2/FLU/RSV testing.  Fact Sheet for Patients: BloggerCourse.com  Fact Sheet for Healthcare Providers: SeriousBroker.it  This test is not yet approved or cleared by the [place]  FDA and has been authorized for detection and/or diagnosis of SARS-CoV-2 by FDA under an Emergency Use Authorization (EUA). This EUA will remain in effect (meaning this test can be used) for the duration of the COVID-19 declaration under Section 564(b)(1) of the Act, 21 U.S.C. section 360bbb-3(b)(1), unless the authorization is terminated or revoked.     Resp Syncytial Virus by PCR NEGATIVE NEGATIVE Final    Comment: (NOTE) Fact Sheet for Patients: BloggerCourse.com  Fact Sheet for  Healthcare Providers: SeriousBroker.it  This test is not yet approved or cleared by the [place]  FDA and has been authorized for detection and/or diagnosis of SARS-CoV-2 by FDA under an Emergency Use Authorization (EUA). This EUA will remain in effect (meaning this test can be used) for the duration of the COVID-19 declaration under Section 564(b)(1) of the Act, 21 U.S.C. section 360bbb-3(b)(1), unless the authorization is terminated or revoked.  Performed at [facility], [address] [place], [place] [address]   MRSA Next Gen by PCR, Nasal     Status: None   Collection Time: 01/04/24  9:39 PM   Specimen: Nasal Mucosa; Nasal Swab  Result Value Ref Range Status   MRSA by PCR Next Gen NOT DETECTED NOT DETECTED Final    Comment: (NOTE) The GeneXpert MRSA Assay (FDA approved for NASAL specimens only), is  one component of a comprehensive MRSA colonization surveillance program. It is not intended to diagnose MRSA infection nor to guide or monitor treatment for MRSA infections. Test performance is not FDA approved in patients less than 18 years old. Performed at [facility], [address] [place], [place] [address]      Radiological Exams on Admission:   Assessment/Plan Principal Problem:   Symptomatic anemia Active Problems:   GI bleeding   Iron deficiency anemia   UTI (urinary tract infection)   Pulmonary embolism (HCC)   DVT (deep venous thrombosis)_REU   GERD (gastroesophageal reflux disease)   COPD (chronic obstructive pulmonary disease) (HCC)   Tobacco use disorder   Thrombocytosis   Colon cancer (HCC)   Pressure injury of skin   Protein calorie malnutrition   Hyponatremia   Assessment and Plan:  Symptomatic anemia and iron deficiency anemia: Hgb dropped drom 8.1 on  12/24/23 --> 5.9. No active GI bleeding noted in ileostomy bag.  Since patient is on Eliquis and has history of colon cancer, still suspecting  GI blood loss. Epic message is scheduled to be sent to Dr. Maryruth of GI for consult at 6 AM  - will admitted to PCU as inpatient - transfuse 2 units of blood now -Temporarily hold Eliquis - NPO after MN, pending GI consult.  - IVF: 1L NS bolus, then at 75 mL/hr - Start IV pantoprazole   - Zofran IV for nausea - Avoid NSAIDs and SQ heparin - Maintain IV access (2 large bore IVs if possible). - Monitor closely and follow q6h cbc, transfuse as necessary, if Hgb<7.0 - LaB: INR, PTT and type screen   Addendum_ GI bleeding: per floor RN report, pt has had 400 cc of black tarry stool from his colostomy bag. It is clear that pt has GI bleeding. -will continue give blood transfusion as above  -f/u CBC q6h as above   Possible UTI (urinary tract infection): Patient has positive UA, denies symptoms UTI.  Since patient has leukocytosis WBC 20.1, will start antibiotics empirically. -IV Rocephin - Follow-up blood culture and urine culture  History of pulmonary embolism (HCC) and DVT (deep venous thrombosis)_REU -Temporarily hold Eliquis.  GERD (gastroesophageal reflux disease) -On Protonix  COPD (chronic obstructive pulmonary disease) (HCC): Patient has mild SOB and mild cough with little white mucus production, no fever, clinically does not have pneumonia.  No oxygen desaturation. - Bronchodilators as needed Mucinex  Tobacco use disorder -Routine patch  Thrombocytosis: Platelets 1405, likely reactive. -Check LDH and peripheral smear  Colon cancer [facility]): S/p of partial colectomy with ileostomy. - Follow-up with oncology  Pressure injury of skin: Has stage IV sacral ulcer -Wound care consult  Protein calorie malnutrition: Patient is cachectic looking.  Body weight 67.3 kg, BMI 17.59 - Ensure - Consult nutrition.  Hyponatremia: Sodium 129, mental status normal.  Likely due to poor oral intake and dehydration. - Will check urine sodium, urine osmolality, serum osmolality. - Fluid  restriction - IVF: 1L NS in ED, will continue with IV normal saline at 75 mL/h - Sodium chloride tablet 1 g twice daily - f/u by BMP q8h - avoid over correction too fast due to risk of central pontine myelinolysis      DVT ppx: SCD  Code Status: Full code   Family Communication:     not done, no family member is at bed side.   Disposition Plan:  Anticipate discharge back to previous environment  Consults called:  Epic message is scheduled to  be sent to Dr. Maryruth of GI for consult at 6 AM  Admission status and Level of care: Progressive:  as inpt        Dispo: The patient is from: SNF              Anticipated d/c is to: SNF              Anticipated d/c date is: 2 days              Patient currently is not medically stable to d/c.    Severity of Illness:  The appropriate patient status for this patient is INPATIENT. Inpatient status is judged to be reasonable and necessary in order to provide the required intensity of service to ensure the patient's safety. The patient's presenting symptoms, physical exam findings, and initial radiographic and laboratory data in the context of their chronic comorbidities is felt to place them at high risk for further clinical deterioration. Furthermore, it is not anticipated that the patient will be medically stable for discharge from the hospital within 2 midnights of admission.   * I certify that at the point of admission it is my clinical judgment that the patient will require inpatient hospital care spanning beyond 2 midnights from the point of admission due to high intensity of service, high risk for further deterioration and high frequency of surveillance required.*       Date of Service 01/05/2024    Caleb Exon [facility]   If 7PM-7AM, please contact night-coverage [email or web address] 01/05/2024, 1:41 AM

## 2024-01-05 DIAGNOSIS — D649 Anemia, unspecified: Secondary | ICD-10-CM | POA: Diagnosis not present

## 2024-01-05 DIAGNOSIS — K922 Gastrointestinal hemorrhage, unspecified: Secondary | ICD-10-CM | POA: Diagnosis present

## 2024-01-05 LAB — BASIC METABOLIC PANEL WITH GFR
Anion gap: 15 (ref 5–15)
Anion gap: 13 (ref 5–15)
Anion gap: 15 (ref 5–15)
Anion gap: 12 (ref 5–15)
BUN: 12 mg/dL (ref 8–23)
BUN: 12 mg/dL (ref 8–23)
BUN: 13 mg/dL (ref 8–23)
BUN: 12 mg/dL (ref 8–23)
CO2: 20 mmol/L — ABNORMAL LOW (ref 22–32)
CO2: 21 mmol/L — ABNORMAL LOW (ref 22–32)
CO2: 21 mmol/L — ABNORMAL LOW (ref 22–32)
CO2: 22 mmol/L (ref 22–32)
Calcium: 8.5 mg/dL — ABNORMAL LOW (ref 8.9–10.3)
Calcium: 8.4 mg/dL — ABNORMAL LOW (ref 8.9–10.3)
Calcium: 8.6 mg/dL — ABNORMAL LOW (ref 8.9–10.3)
Calcium: 8.4 mg/dL — ABNORMAL LOW (ref 8.9–10.3)
Chloride: 98 mmol/L (ref 98–111)
Chloride: 97 mmol/L — ABNORMAL LOW (ref 98–111)
Chloride: 97 mmol/L — ABNORMAL LOW (ref 98–111)
Chloride: 98 mmol/L (ref 98–111)
Creatinine, Ser: 0.67 mg/dL (ref 0.61–1.24)
Creatinine, Ser: 0.67 mg/dL (ref 0.61–1.24)
Creatinine, Ser: 0.56 mg/dL — ABNORMAL LOW (ref 0.61–1.24)
Creatinine, Ser: 0.69 mg/dL (ref 0.61–1.24)
GFR, Estimated: >60 mL/min (ref >60)
GFR, Estimated: >60 mL/min (ref >60)
GFR, Estimated: >60 mL/min (ref >60)
GFR, Estimated: >60 mL/min (ref >60)
Glucose, Bld: 117 mg/dL — ABNORMAL HIGH (ref 70–99)
Glucose, Bld: 111 mg/dL — ABNORMAL HIGH (ref 70–99)
Glucose, Bld: 101 mg/dL — ABNORMAL HIGH (ref 70–99)
Glucose, Bld: 105 mg/dL — ABNORMAL HIGH (ref 70–99)
Potassium: 3.8 mmol/L (ref 3.5–5.1)
Potassium: 3.9 mmol/L (ref 3.5–5.1)
Potassium: 3.8 mmol/L (ref 3.5–5.1)
Potassium: 3.8 mmol/L (ref 3.5–5.1)
Sodium: 133 mmol/L — ABNORMAL LOW (ref 135–145)
Sodium: 131 mmol/L — ABNORMAL LOW (ref 135–145)
Sodium: 133 mmol/L — ABNORMAL LOW (ref 135–145)
Sodium: 132 mmol/L — ABNORMAL LOW (ref 135–145)

## 2024-01-05 LAB — TYPE AND SCREEN
ABO/RH(D): A POS
Antibody Screen: NEGATIVE
Unit division: 0
Unit division: 0

## 2024-01-05 LAB — CBC
HCT: 24.5 % — ABNORMAL LOW (ref 39.0–52.0)
HCT: 24.8 % — ABNORMAL LOW (ref 39.0–52.0)
HCT: 25.6 % — ABNORMAL LOW (ref 39.0–52.0)
HCT: 24 % — ABNORMAL LOW (ref 39.0–52.0)
Hemoglobin: 8.2 g/dL — ABNORMAL LOW (ref 13.0–17.0)
Hemoglobin: 8.1 g/dL — ABNORMAL LOW (ref 13.0–17.0)
Hemoglobin: 8.4 g/dL — ABNORMAL LOW (ref 13.0–17.0)
Hemoglobin: 7.9 g/dL — ABNORMAL LOW (ref 13.0–17.0)
MCH: 28 pg (ref 26.0–34.0)
MCH: 28 pg (ref 26.0–34.0)
MCH: 27.7 pg (ref 26.0–34.0)
MCH: 27.8 pg (ref 26.0–34.0)
MCHC: 33.5 g/dL (ref 30.0–36.0)
MCHC: 32.7 g/dL (ref 30.0–36.0)
MCHC: 32.8 g/dL (ref 30.0–36.0)
MCHC: 32.9 g/dL (ref 30.0–36.0)
MCV: 83.6 fL (ref 80.0–100.0)
MCV: 85.8 fL (ref 80.0–100.0)
MCV: 84.5 fL (ref 80.0–100.0)
MCV: 84.5 fL (ref 80.0–100.0)
Platelets: 1321 K/uL (ref 150–400)
Platelets: 1302 K/uL (ref 150–400)
Platelets: 1334 K/uL (ref 150–400)
Platelets: 1292 K/uL (ref 150–400)
RBC: 2.93 MIL/uL — ABNORMAL LOW (ref 4.22–5.81)
RBC: 2.89 MIL/uL — ABNORMAL LOW (ref 4.22–5.81)
RBC: 3.03 MIL/uL — ABNORMAL LOW (ref 4.22–5.81)
RBC: 2.84 MIL/uL — ABNORMAL LOW (ref 4.22–5.81)
RDW: 17.9 % — ABNORMAL HIGH (ref 11.5–15.5)
RDW: 17.9 % — ABNORMAL HIGH (ref 11.5–15.5)
RDW: 18.2 % — ABNORMAL HIGH (ref 11.5–15.5)
RDW: 18.1 % — ABNORMAL HIGH (ref 11.5–15.5)
WBC: 24.5 K/uL — ABNORMAL HIGH (ref 4.0–10.5)
WBC: 25.1 K/uL — ABNORMAL HIGH (ref 4.0–10.5)
WBC: 22.9 K/uL — ABNORMAL HIGH (ref 4.0–10.5)
WBC: 18.4 K/uL — ABNORMAL HIGH (ref 4.0–10.5)
nRBC: 0 % (ref 0.0–0.2)
nRBC: 0 % (ref 0.0–0.2)
nRBC: 0 % (ref 0.0–0.2)
nRBC: 0 % (ref 0.0–0.2)

## 2024-01-05 LAB — BPAM RBC
Blood Product Expiration Date: 202511172359
Blood Product Expiration Date: 202511172359
ISSUE DATE / TIME: 202510181850
ISSUE DATE / TIME: 202510182231
Unit Type and Rh: 6200
Unit Type and Rh: 6200

## 2024-01-05 LAB — PROTIME-INR
INR: 1.2 (ref 0.8–1.2)
Prothrombin Time: 15.9 s — ABNORMAL HIGH (ref 11.4–15.2)

## 2024-01-05 LAB — MAGNESIUM: Magnesium: 1.6 mg/dL — ABNORMAL LOW (ref 1.7–2.4)

## 2024-01-05 LAB — PHOSPHORUS: Phosphorus: 4.1 mg/dL (ref 2.5–4.6)

## 2024-01-05 LAB — GLUCOSE, CAPILLARY: Glucose-Capillary: 109 mg/dL — ABNORMAL HIGH (ref 70–99)

## 2024-01-05 LAB — MRSA NEXT GEN BY PCR, NASAL: MRSA by PCR Next Gen: NOT DETECTED

## 2024-01-05 LAB — OSMOLALITY: Osmolality: 274 mosm/kg — ABNORMAL LOW (ref 275–295)

## 2024-01-05 LAB — APTT: aPTT: 48 s — ABNORMAL HIGH (ref 24–36)

## 2024-01-05 NOTE — Progress Notes (Signed)
 Two blood transfusion completed without any reaction. Will continue to monitor.

## 2024-01-05 NOTE — Consult Note (Signed)
 WOC Nurse Consult Note: Reason for Consult: sacral wound Incidentally noted to have midline wound; right ischial wound and colostomy.  Surgery performed at outside facility, not familiar to WOC nursing team  Wound type: Stage 4, chronic pressure injury: sacrum Stage 3, pressure injury; right ischium Partial thickness opening at midline line surgical wound; healing Pressure Injury POA: Yes Measurement: see nursing flow sheets Wound bed: wounds are pink and clean, some fibrionus/slough in the base of the sacral wound; otherwise all clean Drainage (amount, consistency, odor) see nursing flow sheets Periwound: intact  Dressing procedure/placement/frequency: Silicone foam dressings to the right ischium and midline wound, change every 3 days. ASSESS UNDER dressings each shift for any acute changes in the wounds.  Cleanse sacral wound with Vashe Soila # [phone]), pack wound with Vahse moist (not soaked gauze), top with ABD pad, secure with tape. Change daily   Provided ostomy care orders and supply numbers for items needed to care for patient while inpatient. Order Supplies: Gerlean # [id number] 2 3/4 pouch/Lawson # 2 2 3/4 skin barrier   Re consult if needed, will not follow at this time. Thanks  Mertie Haslem [employer], RN,CWOCN, CNS, [employer] [phone]

## 2024-01-05 NOTE — Plan of Care (Signed)

## 2024-01-05 NOTE — Progress Notes (Signed)
 I  emptied 400 cc of black tarry stool from his colostomy bag. Dr. Lawence notified.

## 2024-01-05 NOTE — Progress Notes (Signed)
 PROGRESS NOTE    Shane [name]   FMW:[id number] DOB: 06-12-1950  DOA: 01/04/2024 Date of Service: 01/05/24 which is hospital day 1  PCP: Patient, No Pcp Per    Hospital course / significant events:   Shane [name] is a 73 y.o. 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 Presented to ED w/ weakness and lightheadedness   HPI: He had recent month long hospitalization at [facility] due to descending colon mass causing obstruction that led to cecal perforation requiring right hemicolectomy and ileostomy.   10/18: Hgb 5.9 in ED, also UA concern for UTI. Was admitted to hospitalist service, also concern for dark bloody stool in ostomy. See A/P. 10/19: GI consult - monitor for now.   Per GI: The descending colon mass is still present and on CT imaging from [facility], he may have a fistula between the colon mass and the small bowel. He has widely metastatic disease including liver, lungs, and peritoneum.    Consultants:  Gastroenterology  Procedures/Surgeries: none      ASSESSMENT & PLAN:   Symptomatic anemia  Chronic iron deficiency anemia ABLA GI bleed in setting of metastatic cancer, Eliquis  S/p 2 units PRBC IV pantoprazole   Zofran IV for nausea Avoid NSAIDs and SQ heparin Maintain IV access (2 large bore IVs if possible). q6h cbc, transfuse as necessary, if Hgb<7.0 GI following - no intervention at this time    Possible UTI (urinary tract infection): Patient has positive UA, denies symptoms UTI.  Since patient has leukocytosis WBC 20.1, will antibiotics empirically. IV Rocephin Follow-up blood culture and urine culture   History of pulmonary embolism (HCC) and DVT (deep venous thrombosis) Need to hold Eliquis in setting of bleed   GERD (gastroesophageal reflux disease) On Protonix   COPD (chronic  obstructive pulmonary disease)  Patient has mild SOB and mild cough with little white mucus production, no fever, clinically does not have pneumonia.  No oxygen desaturation. Bronchodilators  as needed Mucinex   Tobacco use disorder nicotine patch   Thrombocytosis Platelets 1405, likely reactive. Check LDH and peripheral smear   Colon cancer S/p partial colectomy with ileostomy. Follow-up with oncology   Pressure injury of skin stage IV sacral ulcer POA Wound care   Protein calorie malnutrition, severe  Ensure Consult dietician.   Hyponatremia:  Sodium 129, mental status normal.   Likely due to poor oral intake and dehydration, metastatic disease . Monitor BMP  Advanced care planning D/w patient on rounds - advised his body is weak enough that in my judgment, CPR would cause severe pain with no benefit and we should consider DNR. He seems understanding of this and states woudn't want CPR. I advised him also that his cancer may be invading into a blood vessel and that is causing his GI bleed from the intestine, he seems confused about this but perhaps needs time to process but does not demonstrate a good understanding of the seriousness of his condition - limited capacity but can voice goal which are to improve/get better and he wants to talk more with his daughter about this. I spoke 01/05/24 4:38 PM on the phone w/ his daughter Gerardine Preston) and reviewed records as pt was previously noted to want NO chest compressions but all other interventions were desired. Nena corroborates this and agrees for DNR with NO chest compressions but  OK for cardioversion, intubation/ventilation.    Underweight based on BMI: Body mass index is 17.59 kg/m.[name] Significantly low or high BMI is associated with higher medical risk.  Underweight - under 18  overweight - 25 to 29 obese - 30 or more Class 1 obesity: BMI of 30.0 to 34 Class 2 obesity: BMI of 35.0 to 39 Class 3 obesity: BMI of 40.0 to  49 Super Morbid Obesity: BMI 50-59 Super-super Morbid Obesity: BMI 60+ Healthy nutrition and physical activity advised as adjunct to other disease management and risk reduction treatments    DVT prophylaxis: SCD w/ GIB and recent anticoagulation IV fluids:  continuous IV fluids  Nutrition: CLD Central lines / other devices: ileostomy  Code Status: NO chest compressions ACP documentation reviewed:  none on file in [facility]  TOC needs: Back to SNF when stable Medical barriers to dispo: monitoring GI bleed. Expected medical readiness for discharge next 1-2 days if remains stable.              Subjective / Brief ROS:  Patient reports feeling better today not as weak Denies CP/SOB.  Pain controlled.  Denies new weakness..  Reports no concerns w/ urination/defecation.   Family Communication: spoke w/ daughter see A/P    Objective Findings:  Vitals:   01/05/24 0437 01/05/24 0900 01/05/24 1123 01/05/24 1553  BP: 106/64 100/66 (!) 99/59 96/66  Pulse: (!) 115 (!) 106 (!) 102 100  Resp: 18 19 17 19   Temp: 98.6 F (37 C) 99.3 F (37.4 C) 98.5 F (36.9 C) 98.5 F (36.9 C)  TempSrc:      SpO2: 93% 100% 100% 99%  Weight:      Height:        Intake/Output Summary (Last 24 hours) at 01/05/2024 1641 Last data filed at 01/05/2024 1417 Gross per 24 hour  Intake 1580.75 ml  Output 800 ml  Net 780.75 ml   Filed Weights   01/04/24 2100  Weight: 67.3 kg    Examination:  Physical Exam Constitutional:      General: He is not in acute distress.    Appearance: He is ill-appearing.  Cardiovascular:     Rate and Rhythm: Normal rate and regular rhythm.     Heart sounds: Murmur heard.  Pulmonary:     Breath sounds: Normal breath sounds.  Abdominal:     General: Bowel sounds are normal.     Palpations: Abdomen is soft.  Musculoskeletal:     Right lower leg: No edema.     Left lower leg: No edema.  Neurological:     Mental Status: He is alert and oriented to person,  place, and time.  Psychiatric:        Mood and Affect: Mood normal.        Behavior: Behavior normal.          Scheduled Medications:   ascorbic acid  500 mg Oral Daily   Chlorhexidine Gluconate Cloth  6 each Topical Daily   feeding supplement  237 mL Oral BID BM   ferrous sulfate  325 mg Oral Q12H   loperamide  2 mg Oral QID   melatonin  2.5 mg Oral QHS   nicotine  21 mg Transdermal Daily   pantoprazole (PROTONIX) IV  40 mg Intravenous Q12H   sodium chloride flush  10-40 mL Intracatheter Q12H   sodium chloride  1 g Oral BID WC    Continuous Infusions:  sodium chloride 75 mL/hr at 01/05/24 0900   cefTRIAXone (ROCEPHIN)  IV Stopped (01/04/24 1938)    PRN Medications:  acetaminophen , albuterol, alum & mag hydroxide-simeth, dextromethorphan-guaiFENesin, ondansetron (ZOFRAN) IV, sodium chloride flush  Antimicrobials from admission:  Anti-infectives (From admission, onward)    Start     Dose/Rate Route Frequency Ordered Stop   01/04/24 1930  cefTRIAXone (ROCEPHIN) 1 g in sodium chloride 0.9 % 100 mL IVPB        1 g 200 mL/hr over 30 Minutes Intravenous Every 24 hours 01/04/24 1919             Data Reviewed:  I have personally reviewed the following...  CBC: Recent Labs  Lab 01/04/24 1636 01/05/24 0530 01/05/24 0839 01/05/24 1300  WBC 20.1* 24.5* 25.1* 22.9*  NEUTROABS 17.1*  --   --   --   HGB 5.9* 8.2* 8.1* 8.4*  HCT 18.8* 24.5* 24.8* 25.6*  MCV 82.1 83.6 85.8 84.5  PLT 1,405* 1,321* 1,302* 1,334*   Basic Metabolic Panel: Recent Labs  Lab 01/04/24 1636 01/05/24 0530 01/05/24 0832 01/05/24 1443  NA 129* 133* 131* 133*  K 4.8 3.8 3.9 3.8  CL 97* 98 97* 97*  CO2 22 20* 21* 21*  GLUCOSE 100* 117* 111* 101*  BUN 13 12 12 13   CREATININE 0.67 0.67 0.67 0.56*  CALCIUM 8.6* 8.5* 8.4* 8.6*  MG  --  1.6*  --   --   PHOS  --  4.1  --   --    GFR: Estimated Creatinine Clearance: 75.9 mL/min (A) (by C-G formula based on SCr of 0.56 mg/dL (L)). Liver  Function Tests: Recent Labs  Lab 01/04/24 1636  AST 22  ALT 9  ALKPHOS 85  BILITOT 0.8  PROT 7.5  ALBUMIN 2.3*   No results for input(s): LIPASE, AMYLASE in the last 168 hours. No results for input(s): AMMONIA in the last 168 hours. Coagulation Profile: Recent Labs  Lab 01/05/24 0530  INR 1.2   Cardiac Enzymes: No results for input(s): CKTOTAL, CKMB, CKMBINDEX, TROPONINI in the last 168 hours. BNP (last 3 results) No results for input(s): PROBNP in the last 8760 hours. HbA1C: No results for input(s): HGBA1C in the last 72 hours. CBG: Recent Labs  Lab 01/05/24 0902  GLUCAP 109*   Lipid Profile: No results for input(s): CHOL, HDL, LDLCALC, TRIG, CHOLHDL, LDLDIRECT in the last 72 hours. Thyroid Function Tests: No results for input(s): TSH, T4TOTAL, FREET4, T3FREE, THYROIDAB in the last 72 hours. Anemia Panel: No results for input(s): VITAMINB12, FOLATE, FERRITIN, TIBC, IRON, RETICCTPCT in the last 72 hours. Most Recent Urinalysis On File:     Component Value Date/Time   COLORURINE YELLOW (A) 01/04/2024 1750   APPEARANCEUR CLOUDY (A) 01/04/2024 1750   LABSPEC 1.015 01/04/2024 1750   PHURINE 5.0 01/04/2024 1750   GLUCOSEU NEGATIVE 01/04/2024 1750   HGBUR NEGATIVE 01/04/2024 1750   BILIRUBINUR NEGATIVE 01/04/2024 1750   KETONESUR NEGATIVE 01/04/2024 1750   PROTEINUR 30 (A) 01/04/2024 1750   NITRITE POSITIVE (A) 01/04/2024 1750   LEUKOCYTESUR LARGE (A) 01/04/2024 1750   Sepsis Labs: @LABRCNTIP (procalcitonin:4,lacticidven:4) Microbiology: Recent Results (from the past 240 hours)  Resp panel by RT-PCR (RSV, Flu A&B, Covid) Anterior Nasal Swab     Status: None   Collection Time: 01/04/24  4:49 PM   Specimen: Anterior Nasal Swab  Result Value Ref Range Status   SARS Coronavirus 2 by RT PCR NEGATIVE NEGATIVE Final    Comment: (NOTE) SARS-CoV-2 target nucleic acids are NOT DETECTED.  The SARS-CoV-2 RNA is generally  detectable in  upper respiratory specimens during the acute phase of infection. The lowest concentration of SARS-CoV-2 viral copies this assay can detect is 138 copies/mL. A negative result does not preclude SARS-Cov-2 infection and should not be used as the sole basis for treatment or other patient management decisions. A negative result may occur with  improper specimen collection/handling, submission of specimen other than nasopharyngeal swab, presence of viral mutation(s) within the areas targeted by this assay, and inadequate number of viral copies(<138 copies/mL). A negative result must be combined with clinical observations, patient history, and epidemiological information. The expected result is Negative.  Fact Sheet for Patients:  BloggerCourse.com  Fact Sheet for Healthcare Providers:  SeriousBroker.it  This test is no t yet approved or cleared by the [place]  FDA and  has been authorized for detection and/or diagnosis of SARS-CoV-2 by FDA under an Emergency Use Authorization (EUA). This EUA will remain  in effect (meaning this test can be used) for the duration of the COVID-19 declaration under Section 564(b)(1) of the Act, 21 U.S.C.section 360bbb-3(b)(1), unless the authorization is terminated  or revoked sooner.       Influenza A by PCR NEGATIVE NEGATIVE Final   Influenza B by PCR NEGATIVE NEGATIVE Final    Comment: (NOTE) The Xpert Xpress SARS-CoV-2/FLU/RSV plus assay is intended as an aid in the diagnosis of influenza from Nasopharyngeal swab specimens and should not be used as a sole basis for treatment. Nasal washings and aspirates are unacceptable for Xpert Xpress SARS-CoV-2/FLU/RSV testing.  Fact Sheet for Patients: BloggerCourse.com  Fact Sheet for Healthcare Providers: SeriousBroker.it  This test is not yet approved or cleared by the [place]  FDA  and has been authorized for detection and/or diagnosis of SARS-CoV-2 by FDA under an Emergency Use Authorization (EUA). This EUA will remain in effect (meaning this test can be used) for the duration of the COVID-19 declaration under Section 564(b)(1) of the Act, 21 U.S.C. section 360bbb-3(b)(1), unless the authorization is terminated or revoked.     Resp Syncytial Virus by PCR NEGATIVE NEGATIVE Final    Comment: (NOTE) Fact Sheet for Patients: BloggerCourse.com  Fact Sheet for Healthcare Providers: SeriousBroker.it  This test is not yet approved or cleared by the [place]  FDA and has been authorized for detection and/or diagnosis of SARS-CoV-2 by FDA under an Emergency Use Authorization (EUA). This EUA will remain in effect (meaning this test can be used) for the duration of the COVID-19 declaration under Section 564(b)(1) of the Act, 21 U.S.C. section 360bbb-3(b)(1), unless the authorization is terminated or revoked.  Performed at [facility], [address] [place], [place] [address]   MRSA Next Gen by PCR, Nasal     Status: None   Collection Time: 01/04/24  9:39 PM   Specimen: Nasal Mucosa; Nasal Swab  Result Value Ref Range Status   MRSA by PCR Next Gen NOT DETECTED NOT DETECTED Final    Comment: (NOTE) The GeneXpert MRSA Assay (FDA approved for NASAL specimens only), is one component of a comprehensive MRSA colonization surveillance program. It is not intended to diagnose MRSA infection nor to guide or monitor treatment for MRSA infections. Test performance is not FDA approved in patients less than 5 years old. Performed at [facility], [address], [place], [place] [address]       Radiology Studies last 3 days: [facility] 1 View Result Date: 01/04/2024 CLINICAL DATA:  Shortness of breath.  History of colon cancer. EXAM: PORTABLE CHEST 1 VIEW COMPARISON:  None Available. FINDINGS:  Right chest port tip in the right atrium. Volume loss in the right hemithorax with pleural effusion and opacity in the mid lower lung zone. The heart is grossly normal in size. Aortic atherosclerosis and tortuosity. Left lung is grossly clear. No pneumothorax. The bones are subjectively under mineralized. IMPRESSION: Volume loss in the right hemithorax with pleural effusion and opacity in the mid lower lung zone. This may represent atelectasis, pneumonia, or neoplasm. Recommend correlation with any prior outside imaging. Electronically Signed   By: Andrea Gasman M.D.   On: 01/04/2024 17:26       Time spent: 50 min     Ferry Matthis, DO [facility] 01/05/2024, 4:41 PM    Dictation software may have been used to generate the above note. Typos may occur and escape review in typed/dictated notes. Please contact Dr Marsa directly for clarity if needed.  Staff may message me via secure chat in Epic  but this may not receive an immediate response,  please page me for urgent matters!  If 7PM-7AM, please contact night coverage [email or web address]

## 2024-01-05 NOTE — Consult Note (Addendum)
 Consultation  Referring Provider: Hospitalist     Admit date: 10/18 Consult date: 10/19         Reason for Consultation: Anemia/Melena             HPI:   Shane [name] is a 73 y.o. gentleman with recent month long hospitalization at [facility] due to descending colon mass causing obstruction that led to cecal perforation requiring right hemicolectomy and ileostomy. This means the descending colon mass is still present and on CT imaging from [facility], he may have a fistula between the colon mass and the small bowel. He has widely metastatic disease including liver, lungs, and peritoneum. He is here due to anemia and dark ostomy output. He does have a stage IV sacral decubitus ulcer. He is not exactly sure of the medicines he takes. He does take a DOAC due to PE with unknown timing of last dose. Nursing notes dark output in ostomy but patient notes it's been dark since his surgery. His U/A was extremely dirty as well and he presented with elevated white count. Infectious work-up on going.  Past Medical History:  Diagnosis Date   Colon cancer (HCC)    GERD (gastroesophageal reflux disease)    Iron deficiency anemia    Knee injury 03/19/2018   Leg injury 12/17/2017   Pulmonary embolism (HCC)    Tobacco use disorder     Past Surgical History:  Procedure Laterality Date   FRACTURE SURGERY Right    foot    Family History  Problem Relation Age of Onset   Diabetes Mother    Hypertension Mother    Hypertension Father      Social History   Tobacco Use   Smoking status: Every Day    Current packs/day: 0.50    Average packs/day: 0.5 packs/day for 42.0 years (21.0 ttl pk-yrs)    Types: Cigarettes   Smokeless tobacco: Never  Vaping Use   Vaping status: Never Used  Substance Use Topics   Alcohol use: Yes    Alcohol/week: 6.0 standard drinks of alcohol    Types: 6 Cans of beer per week    Comment: occassionally during week with social friends    Drug use: Never    Prior to Admission  medications   Medication Sig Start Date End Date Taking? Authorizing Provider  acetaminophen  (TYLENOL ) 325 MG tablet Take 650 mg by mouth every 6 (six) hours as needed for mild pain (pain score 1-3).   Yes [provider]  albuterol (VENTOLIN HFA) 108 (90 Base) MCG/ACT inhaler Inhale 2 puffs into the lungs every 4 (four) hours as needed for wheezing.   Yes [provider]  alum & mag hydroxide-simeth (MAALOX/MYLANTA) 200-200-20 MG/5ML suspension Take 30 mLs by mouth every 4 (four) hours as needed for indigestion or heartburn.   Yes [provider]  apixaban (ELIQUIS) 5 MG TABS tablet Take 5 mg by mouth 2 (two) times daily.   Yes [provider]  ascorbic acid (VITAMIN C) 500 MG tablet Take 500 mg by mouth daily.   Yes [provider]  ferrous sulfate 325 (65 FE) MG tablet Take 325 mg by mouth every 12 (twelve) hours.   Yes [provider]  loperamide (IMODIUM) 2 MG capsule Take 2 mg by mouth 4 (four) times daily.   Yes [provider]  melatonin 3 MG TABS tablet Take 3 mg by mouth at bedtime.   Yes [provider]  pantoprazole (PROTONIX) 40 MG tablet Take 40 mg  by mouth daily.   Yes [provider]  Multiple Vitamins-Minerals (ENERGY BOOSTER PO) Take by mouth. Patient not taking: Reported on 01/04/2024    [provider]    Current Facility-Administered Medications  Medication Dose Route Frequency Provider Last Rate Last Admin   0.9 %  sodium chloride infusion   Intravenous Continuous Niu, Xilin, MD 75 mL/hr at 01/05/24 0900 Infusion Verify at 01/05/24 0900   acetaminophen  (TYLENOL ) tablet 650 mg  650 mg Oral Q6H PRN Niu, Xilin, MD   650 mg at 01/04/24 2244   albuterol (PROVENTIL) (2.5 MG/3ML) 0.083% nebulizer solution 2.5 mg  2.5 mg Inhalation Q4H PRN Niu, Xilin, MD       alum & mag hydroxide-simeth (MAALOX/MYLANTA) 200-200-20 MG/5ML suspension 30 mL  30 mL Oral Q4H PRN Niu, Xilin, MD       ascorbic acid  (VITAMIN C) tablet 500 mg  500 mg Oral Daily Niu, Xilin, MD   500 mg at 01/05/24 0900   cefTRIAXone (ROCEPHIN) 1 g in sodium chloride 0.9 % 100 mL IVPB  1 g Intravenous Q24H Niu, Xilin, MD   Paused at 01/04/24 1938   Chlorhexidine Gluconate Cloth 2 % PADS 6 each  6 each Topical Daily Niu, Xilin, MD   6 each at 01/05/24 0944   dextromethorphan-guaiFENesin (MUCINEX DM) 30-600 MG per 12 hr tablet 1 tablet  1 tablet Oral BID PRN Niu, Xilin, MD       feeding supplement (ENSURE PLUS HIGH PROTEIN) liquid 237 mL  237 mL Oral BID BM Niu, Xilin, MD   237 mL at 01/05/24 0944   ferrous sulfate tablet 325 mg  325 mg Oral Q12H Niu, Xilin, MD   325 mg at 01/05/24 0900   loperamide (IMODIUM) capsule 2 mg  2 mg Oral QID Niu, Xilin, MD   2 mg at 01/05/24 0900   melatonin tablet 2.5 mg  2.5 mg Oral QHS Niu, Xilin, MD   2.5 mg at 01/04/24 2244   nicotine (NICODERM CQ - dosed in mg/24 hours) patch 21 mg  21 mg Transdermal Daily Niu, Xilin, MD   21 mg at 01/05/24 0900   ondansetron (ZOFRAN) injection 4 mg  4 mg Intravenous Q8H PRN Niu, Xilin, MD       pantoprazole (PROTONIX) injection 40 mg  40 mg Intravenous Q12H Niu, Xilin, MD   40 mg at 01/05/24 0900   sodium chloride flush (NS) 0.9 % injection 10-40 mL  10-40 mL Intracatheter Q12H Niu, Xilin, MD   10 mL at 01/05/24 0944   sodium chloride flush (NS) 0.9 % injection 10-40 mL  10-40 mL Intracatheter PRN Niu, Xilin, MD       sodium chloride tablet 1 g  1 g Oral BID WC Niu, Xilin, MD   1 g at 01/05/24 0900    Allergies as of 01/04/2024   (No Known Allergies)     Review of Systems:    All systems reviewed and negative except where noted in HPI.  Review of Systems  Constitutional:  Positive for malaise/fatigue. Negative for chills and fever.  Respiratory:  Negative for shortness of breath.   Cardiovascular:  Negative for chest pain.  Gastrointestinal:  Negative for abdominal pain and blood in stool.  Skin:  Negative for rash.  Neurological:  Negative for focal  weakness.  All other systems reviewed and are negative.     Physical Exam:  Vital signs in last 24 hours: Temp:  [97.3 F (36.3 C)-99.5 F (37.5 C)] 99.3  F (37.4 C) (10/19 0900) Pulse Rate:  [93-115] 106 (10/19 0900) Resp:  [18-33] 19 (10/19 0900) BP: (97-121)/(59-74) 100/66 (10/19 0900) SpO2:  [93 %-100 %] 100 % (10/19 0900) Weight:  [67.3 kg] 67.3 kg (10/18 2100) Last BM Date : 01/04/24 General:   Chronically ill, malnourished individual Head:  Normocephalic and atraumatic. Eyes:   No icterus.   Conjunctiva pink. Mouth: Mucosa pink moist, no lesions. Neck:  Supple; no masses felt Lungs:  No respiratory distress  Abdomen:   Flat, soft, nondistended, nontender. Ostomy in place but bag is not clear. Msk:  No clubbing or cyanosis Neurologic:  Alert and  oriented x4;  No focal deficits Skin:  Warm, dry, without significant lesions or rashes. (Sacral decub not examined but documented in chart per primary team) Psych:  Alert and cooperative. Normal affect.  LAB RESULTS: Recent Labs    01/04/24 1636 01/05/24 0530 01/05/24 0839  WBC 20.1* 24.5* 25.1*  HGB 5.9* 8.2* 8.1*  HCT 18.8* 24.5* 24.8*  PLT 1,405* 1,321* 1,302*   BMET Recent Labs    01/04/24 1636 01/05/24 0530 01/05/24 0832  NA 129* 133* 131*  K 4.8 3.8 3.9  CL 97* 98 97*  CO2 22 20* 21*  GLUCOSE 100* 117* 111*  BUN 13 12 12   CREATININE 0.67 0.67 0.67  CALCIUM 8.6* 8.5* 8.4*   LFT Recent Labs    01/04/24 1636  PROT 7.5  ALBUMIN 2.3*  AST 22  ALT 9  ALKPHOS 85  BILITOT 0.8   PT/INR Recent Labs    01/05/24 0530  LABPROT 15.9*  INR 1.2    STUDIES: DG Chest Port 1 View Result Date: 01/04/2024 CLINICAL DATA:  Shortness of breath.  History of colon cancer. EXAM: PORTABLE CHEST 1 VIEW COMPARISON:  None Available. FINDINGS: Right chest port tip in the right atrium. Volume loss in the right hemithorax with pleural effusion and opacity in the mid lower lung zone. The heart is grossly normal in size.  Aortic atherosclerosis and tortuosity. Left lung is grossly clear. No pneumothorax. The bones are subjectively under mineralized. IMPRESSION: Volume loss in the right hemithorax with pleural effusion and opacity in the mid lower lung zone. This may represent atelectasis, pneumonia, or neoplasm. Recommend correlation with any prior outside imaging. Electronically Signed   By: Andrea Gasman M.D.   On: 01/04/2024 17:26       Impression / Plan:   73 y/o gentleman with widely metastatic colon cancer with ileostomy in place who presents with anemia, dark output, and UTI. Difficult situation as I suspect his anemia is multifactorial (cancer, infectious, chronic inflammation). He could have oozing from the ostomy site vs the fistula to the small bowel from the primary cancer. Would await infectious work-up and monitor hemoglobin for now.   - await infectious work-up - monitor hemoglobins and transfuse for hemoglobin < 7 - monitor for overt GI bleeding - continue to hold DOAC - PPI daily - palliative care consult - will continue to follow for now  Please call with any questions or concerns. Dr. Jinny will pick up care tomorrow.  Frederic Schick MD, MPH [facility]

## 2024-01-05 NOTE — Hospital Course (Signed)
 Hospital course / significant events:   Shane [name] is a 73 y.o. 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 Presented to ED w/ weakness and lightheadedness   HPI: He had recent month long hospitalization at [facility] due to descending colon mass causing obstruction that led to cecal perforation requiring right hemicolectomy and ileostomy.   10/18: Hgb 5.9 in ED, also UA concern for UTI. Was admitted to hospitalist service, also concern for dark bloody stool in ostomy. See A/P. 10/19: GI consult - monitor for now. Hgb remains stable 10/20: no further bleeding, advancing diet. Ask TOC to confirm he can go back to facility 10/21: were hoping to get him back to [facility] today but they don't have bed (daughter mistakenly gave it up, miscommunication led to having to start auth all over for rehab, see TOC). At any rate, pt is stable.      Consultants:  Gastroenterology  Procedures/Surgeries: none      ASSESSMENT & PLAN:   Symptomatic anemia  Chronic iron deficiency anemia ABLA GI bleed in setting of metastatic cancer, Eliquis  S/p 2 units PRBC IV pantoprazole   Zofran IV for nausea Avoid NSAIDs and SQ heparin Maintain IV access (2 large bore IVs if possible). 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 Follow-up with oncology  he was supposed to see onc 10/22 for repeat CT chest/abd/pelv, Pembrolizumab first dose. Obviously he is here. Please reach out to his onc team below to alert them he is here and if they want us  to do CT while he is here or not (daughter is asking for it here if he's here). Could probably do CT to assess mass(es) risk of bleed, but also issues w/ transferring images to [facility] docs may be a hindrance to  their follow up so would ask them if they want us  to do it here / wait / ask when they can reschedule him. Sorah, Dorn Lenis, MD - [facility] - Phone: tel:[phone]  Possible UTI (urinary tract infection): Patient has positive UA, denies symptoms UTI.  Since patient has leukocytosis WBC 20.1, will antibiotics empirically. IV Rocephin   History of pulmonary embolism (HCC) and DVT (deep venous thrombosis) Need to hold Eliquis in setting of bleed - daughter is agreeable to this and prefers to hold indefinitely   GERD (gastroesophageal reflux disease) On Protonix   COPD (chronic obstructive pulmonary disease)  Patient has mild SOB and mild cough with little white mucus production, no fever, clinically does not have pneumonia.  No oxygen desaturation. Bronchodilators  as needed Mucinex   Tobacco use disorder nicotine patch   Thrombocytosis Platelets 1405, likely reactive. Check LDH and peripheral smear   Pressure injury of skin stage IV sacral ulcer POA Wound care   Protein calorie malnutrition, severe  Ensure Consult dietician.   Hyponatremia:  Sodium 129, mental status normal.   Likely due to poor oral intake and dehydration, metastatic disease . Monitor BMP  Advanced care planning DNR with NO chest compressions but OK for cardioversion, intubation/ventilation.  The descending colon mass is still present and on CT imaging from [facility], he may have a fistula between the colon mass and the small bowel. He has widely metastatic disease including liver, lungs, and peritoneum. I have discussed w/  patient and daughter the high risk of further bleed, which may be unpredictable and would likely not be able to be stopped, at which point should consider comfort measures in the likely case that there is no treatment which could fix underlying reason for the bleed.  Would strongly recommend palliative care follow up outpatient    Underweight based on BMI: Body mass index is  17.59 kg/m.[name] Significantly low or high BMI is associated with higher medical risk.  Underweight - under 18  overweight - 25 to 29 obese - 30 or more Class 1 obesity: BMI of 30.0 to 34 Class 2 obesity: BMI of 35.0 to 39 Class 3 obesity: BMI of 40.0 to 49 Super Morbid Obesity: BMI 50-59 Super-super Morbid Obesity: BMI 60+ Healthy nutrition and physical activity advised as adjunct to other disease management and risk reduction treatments    DVT prophylaxis: SCD w/ GIB and recent anticoagulation IV fluids:  continuous IV fluids can dc since toelrating  Nutrition: CLD --> regular  Central lines / other devices: ileostomy  Code Status: NO chest compressions ACP documentation reviewed:  none on file in [facility]  TOC needs: Back to SNF when able Medical barriers to dispo: none

## 2024-01-05 NOTE — Plan of Care (Signed)
  Problem: Education: Goal: Knowledge of General Education information will improve Description: Including pain rating scale, medication(s)/side effects and non-pharmacologic comfort measures Outcome: Progressing   Problem: Clinical Measurements: Goal: Respiratory complications will improve Outcome: Progressing   Problem: Activity: Goal: Risk for activity intolerance will decrease Outcome: Not Progressing   Problem: Elimination: Goal: Will not experience complications related to bowel motility Outcome: Not Progressing   Problem: Skin Integrity: Goal: Risk for impaired skin integrity will decrease Outcome: Not Progressing   Problem: Education: Goal: Knowledge of General Education information will improve Description: Including pain rating scale, medication(s)/side effects and non-pharmacologic comfort measures Outcome: Progressing   Problem: Clinical Measurements: Goal: Respiratory complications will improve Outcome: Progressing   Problem: Activity: Goal: Risk for activity intolerance will decrease Outcome: Not Progressing   Problem: Elimination: Goal: Will not experience complications related to bowel motility Outcome: Not Progressing   Problem: Skin Integrity: Goal: Risk for impaired skin integrity will decrease Outcome: Not Progressing

## 2024-01-06 DIAGNOSIS — D649 Anemia, unspecified: Secondary | ICD-10-CM | POA: Diagnosis not present

## 2024-01-06 DIAGNOSIS — E43 Unspecified severe protein-calorie malnutrition: Secondary | ICD-10-CM | POA: Insufficient documentation

## 2024-01-06 LAB — CBC
HCT: 24.3 % — ABNORMAL LOW (ref 39.0–52.0)
HCT: 25.1 % — ABNORMAL LOW (ref 39.0–52.0)
Hemoglobin: 7.8 g/dL — ABNORMAL LOW (ref 13.0–17.0)
Hemoglobin: 8 g/dL — ABNORMAL LOW (ref 13.0–17.0)
MCH: 27.5 pg (ref 26.0–34.0)
MCH: 27.2 pg (ref 26.0–34.0)
MCHC: 32.1 g/dL (ref 30.0–36.0)
MCHC: 31.9 g/dL (ref 30.0–36.0)
MCV: 85.6 fL (ref 80.0–100.0)
MCV: 85.4 fL (ref 80.0–100.0)
Platelets: 1352 K/uL (ref 150–400)
Platelets: 1374 K/uL (ref 150–400)
RBC: 2.84 MIL/uL — ABNORMAL LOW (ref 4.22–5.81)
RBC: 2.94 MIL/uL — ABNORMAL LOW (ref 4.22–5.81)
RDW: 18.6 % — ABNORMAL HIGH (ref 11.5–15.5)
RDW: 18.6 % — ABNORMAL HIGH (ref 11.5–15.5)
WBC: 16.6 K/uL — ABNORMAL HIGH (ref 4.0–10.5)
WBC: 15.6 K/uL — ABNORMAL HIGH (ref 4.0–10.5)
nRBC: 0 % (ref 0.0–0.2)
nRBC: 0.1 % (ref 0.0–0.2)

## 2024-01-06 LAB — GLUCOSE, CAPILLARY: Glucose-Capillary: 91 mg/dL (ref 70–99)

## 2024-01-06 MED ORDER — ADULT MULTIVITAMIN W/MINERALS CH
1.0000 | ORAL_TABLET | Freq: Every day | ORAL | Status: DC
  Administered 2024-01-06 – 2024-01-11 (×6): 1 via ORAL
  Filled 2024-01-06 (×6): qty 1

## 2024-01-06 MED ORDER — PROSOURCE PLUS PO LIQD
30.0000 mL | Freq: Three times a day (TID) | ORAL | Status: DC
  Administered 2024-01-06 – 2024-01-08 (×6): 30 mL via ORAL

## 2024-01-06 MED ORDER — BOOST / RESOURCE BREEZE PO LIQD CUSTOM
1.0000 | Freq: Three times a day (TID) | ORAL | Status: DC
  Administered 2024-01-06 – 2024-01-08 (×5): 1 via ORAL

## 2024-01-06 MED ORDER — ZINC SULFATE 220 (50 ZN) MG PO CAPS
220.0000 mg | ORAL_CAPSULE | Freq: Every day | ORAL | Status: DC
  Administered 2024-01-06 – 2024-01-11 (×6): 220 mg via ORAL
  Filled 2024-01-06 (×6): qty 1

## 2024-01-06 MED ORDER — VITAMIN C 500 MG PO TABS
500.0000 mg | ORAL_TABLET | Freq: Two times a day (BID) | ORAL | Status: DC
  Administered 2024-01-06 – 2024-01-11 (×10): 500 mg via ORAL
  Filled 2024-01-06 (×10): qty 1

## 2024-01-06 NOTE — Plan of Care (Signed)
  Problem: Education: Goal: Knowledge of General Education information will improve Description: Including pain rating scale, medication(s)/side effects and non-pharmacologic comfort measures Outcome: Progressing   Problem: Clinical Measurements: Goal: Ability to maintain clinical measurements within normal limits will improve Outcome: Progressing Goal: Respiratory complications will improve Outcome: Progressing Goal: Cardiovascular complication will be avoided Outcome: Progressing   Problem: Coping: Goal: Level of anxiety will decrease Outcome: Progressing   Problem: Elimination: Goal: Will not experience complications related to urinary retention Outcome: Progressing   Problem: Pain Managment: Goal: General experience of comfort will improve and/or be controlled Outcome: Progressing   Problem: Activity: Goal: Risk for activity intolerance will decrease Outcome: Not Progressing   Problem: Elimination: Goal: Will not experience complications related to bowel motility Outcome: Not Progressing   Problem: Skin Integrity: Goal: Risk for impaired skin integrity will decrease Outcome: Not Progressing

## 2024-01-06 NOTE — Progress Notes (Signed)
 PROGRESS NOTE    Shane [name]   FMW:[id number] DOB: 08-03-1950  DOA: 01/04/2024 Date of Service: 01/06/24 which is hospital day 2  PCP: Patient, No Pcp Per    Hospital course / significant events:   Shane [name] is a 73 y.o. 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 Presented to ED w/ weakness and lightheadedness   HPI: He had recent month long hospitalization at [facility] due to descending colon mass causing obstruction that led to cecal perforation requiring right hemicolectomy and ileostomy.   10/18: Hgb 5.9 in ED, also UA concern for UTI. Was admitted to hospitalist service, also concern for dark bloody stool in ostomy. See A/P. 10/19: GI consult - monitor for now. Hgb remains stable 10/20: no further bleeding, advancing diet      Consultants:  Gastroenterology  Procedures/Surgeries: none      ASSESSMENT & PLAN:   Symptomatic anemia  Chronic iron deficiency anemia ABLA GI bleed in setting of metastatic cancer, Eliquis  S/p 2 units PRBC IV pantoprazole   Zofran IV for nausea Avoid NSAIDs and SQ heparin Maintain IV access (2 large bore IVs if possible). q6h cbc, transfuse as necessary, if Hgb<7.0 GI following - no intervention at this time    Possible UTI (urinary tract infection): Patient has positive UA, denies symptoms UTI.  Since patient has leukocytosis WBC 20.1, will antibiotics empirically. IV Rocephin Follow-up blood culture and urine culture   History of pulmonary embolism (HCC) and DVT (deep venous thrombosis) Need to hold Eliquis in setting of bleed   GERD (gastroesophageal reflux disease) On Protonix   COPD (chronic obstructive pulmonary disease)  Patient has mild SOB and mild cough with little white mucus production, no fever, clinically does not have pneumonia.  No oxygen  desaturation. Bronchodilators  as needed Mucinex   Tobacco use disorder nicotine patch   Thrombocytosis Platelets 1405, likely reactive. Check LDH and peripheral smear   Colon cancer S/p partial colectomy with ileostomy. Follow-up with oncology   Pressure injury of skin stage IV sacral ulcer POA Wound care   Protein calorie malnutrition, severe  Ensure Consult dietician.   Hyponatremia:  Sodium 129, mental status normal.   Likely due to poor oral intake and dehydration, metastatic disease . Monitor BMP  Advanced care planning Yesterday discussion - DNR with NO chest compressions but OK for cardioversion, intubation/ventilation.  The descending colon mass is still present and on CT imaging from [facility], he may have a fistula between the colon mass and the small bowel. He has widely metastatic disease including liver, lungs, and peritoneum. I have discussed w/ patient and daughter the high risk of further bleed, which may be unpredictable and would likely not be able to be stopped, at which point should consider comfort measures in the likely case that there is no treatment which could fix underlying reason for the bleed.  Would strongly recommend palliative care follow up outpatient    Underweight based on BMI: Body mass index is 17.59 kg/m.[name] Significantly low or high BMI is associated with higher medical risk.  Underweight - under 18  overweight - 25 to 29 obese - 30 or more Class 1 obesity: BMI of 30.0 to 34 Class 2 obesity: BMI of 35.0 to 39 Class 3 obesity: BMI of 40.0 to 49 Super Morbid Obesity: BMI 50-59 Super-super Morbid Obesity: BMI  60+ Healthy nutrition and physical activity advised as adjunct to other disease management and risk reduction treatments    DVT prophylaxis: SCD w/ GIB and recent anticoagulation IV fluids:  continuous IV fluids can dc since toelrating  Nutrition: CLD --> regular  Central lines / other devices: ileostomy  Code Status: NO chest  compressions ACP documentation reviewed:  none on file in [facility]  TOC needs: Back to SNF when stable Medical barriers to dispo: monitoring GI bleed. Expected medical readiness for discharge tomorrow if toelrating diet              Subjective / Brief ROS:  Patient reports feeling better today not as weak Denies CP/SOB.  Pain controlled.  Denies new weakness.[name]  He is hungry and would like regular food when possible   Family Communication: spoke w/ daughter at bedside today     Objective Findings:  Vitals:   01/06/24 0035 01/06/24 0351 01/06/24 0756 01/06/24 1213  BP: 104/60 97/66 105/63 109/71  Pulse: 94 98 97 97  Resp: 18 18 18 20   Temp: 98.2 F (36.8 C) (!) 97.3 F (36.3 C) 98.4 F (36.9 C) 98.6 F (37 C)  TempSrc:      SpO2: 100% 100% 96% 100%  Weight:      Height:        Intake/Output Summary (Last 24 hours) at 01/06/2024 1652 Last data filed at 01/06/2024 1100 Gross per 24 hour  Intake 1537.5 ml  Output 1550 ml  Net -12.5 ml   Filed Weights   01/04/24 2100  Weight: 67.3 kg    Examination:  Physical Exam Constitutional:      General: He is not in acute distress.    Appearance: He is ill-appearing.  Cardiovascular:     Rate and Rhythm: Normal rate and regular rhythm.     Heart sounds: Murmur heard.  Pulmonary:     Breath sounds: Normal breath sounds.  Abdominal:     General: Bowel sounds are normal.     Palpations: Abdomen is soft.  Musculoskeletal:     Right lower leg: No edema.     Left lower leg: No edema.  Neurological:     Mental Status: He is alert and oriented to person, place, and time.  Psychiatric:        Mood and Affect: Mood normal.        Behavior: Behavior normal.          Scheduled Medications:   (feeding supplement) PROSource Plus  30 mL Oral TID BM   ascorbic acid  500 mg Oral BID   Chlorhexidine Gluconate Cloth  6 each Topical Daily   feeding supplement  1 Container Oral TID BM   ferrous sulfate  325 mg Oral  Q12H   loperamide  2 mg Oral QID   melatonin  2.5 mg Oral QHS   multivitamin with minerals  1 tablet Oral Daily   nicotine  21 mg Transdermal Daily   pantoprazole (PROTONIX) IV  40 mg Intravenous Q12H   sodium chloride flush  10-40 mL Intracatheter Q12H   sodium chloride  1 g Oral BID WC   zinc sulfate (50mg  elemental zinc)  220 mg Oral Daily    Continuous Infusions:  cefTRIAXone (ROCEPHIN)  IV 1 g (01/05/24 1758)    PRN Medications:  acetaminophen , albuterol, alum & mag hydroxide-simeth, dextromethorphan-guaiFENesin, ondansetron (ZOFRAN) IV, sodium chloride flush  Antimicrobials from admission:  Anti-infectives (From admission, onward)    Start     Dose/Rate  Route Frequency Ordered Stop   01/04/24 1930  cefTRIAXone (ROCEPHIN) 1 g in sodium chloride 0.9 % 100 mL IVPB        1 g 200 mL/hr over 30 Minutes Intravenous Every 24 hours 01/04/24 1919             Data Reviewed:  I have personally reviewed the following...  CBC: Recent Labs  Lab 01/04/24 1636 01/05/24 0530 01/05/24 0839 01/05/24 1300 01/05/24 2258 01/06/24 0843 01/06/24 1148  WBC 20.1*   < > 25.1* 22.9* 18.4* 16.6* 15.6*  NEUTROABS 17.1*  --   --   --   --   --   --   HGB 5.9*   < > 8.1* 8.4* 7.9* 7.8* 8.0*  HCT 18.8*   < > 24.8* 25.6* 24.0* 24.3* 25.1*  MCV 82.1   < > 85.8 84.5 84.5 85.6 85.4  PLT 1,405*   < > 1,302* 1,334* 1,292* 1,352* 1,374*   < > = values in this interval not displayed.   Basic Metabolic Panel: Recent Labs  Lab 01/04/24 1636 01/05/24 0530 01/05/24 0832 01/05/24 1443 01/05/24 2258  NA 129* 133* 131* 133* 132*  K 4.8 3.8 3.9 3.8 3.8  CL 97* 98 97* 97* 98  CO2 22 20* 21* 21* 22  GLUCOSE 100* 117* 111* 101* 105*  BUN 13 12 12 13 12   CREATININE 0.67 0.67 0.67 0.56* 0.69  CALCIUM 8.6* 8.5* 8.4* 8.6* 8.4*  MG  --  1.6*  --   --   --   PHOS  --  4.1  --   --   --    GFR: Estimated Creatinine Clearance: 75.9 mL/min (by C-G formula based on SCr of 0.69 mg/dL). Liver  Function Tests: Recent Labs  Lab 01/04/24 1636  AST 22  ALT 9  ALKPHOS 85  BILITOT 0.8  PROT 7.5  ALBUMIN 2.3*   No results for input(s): LIPASE, AMYLASE in the last 168 hours. No results for input(s): AMMONIA in the last 168 hours. Coagulation Profile: Recent Labs  Lab 01/05/24 0530  INR 1.2   Cardiac Enzymes: No results for input(s): CKTOTAL, CKMB, CKMBINDEX, TROPONINI in the last 168 hours. BNP (last 3 results) No results for input(s): PROBNP in the last 8760 hours. HbA1C: No results for input(s): HGBA1C in the last 72 hours. CBG: Recent Labs  Lab 01/05/24 0902 01/06/24 0753  GLUCAP 109* 91   Lipid Profile: No results for input(s): CHOL, HDL, LDLCALC, TRIG, CHOLHDL, LDLDIRECT in the last 72 hours. Thyroid Function Tests: No results for input(s): TSH, T4TOTAL, FREET4, T3FREE, THYROIDAB in the last 72 hours. Anemia Panel: No results for input(s): VITAMINB12, FOLATE, FERRITIN, TIBC, IRON, RETICCTPCT in the last 72 hours. Most Recent Urinalysis On File:     Component Value Date/Time   COLORURINE YELLOW (A) 01/04/2024 1750   APPEARANCEUR CLOUDY (A) 01/04/2024 1750   LABSPEC 1.015 01/04/2024 1750   PHURINE 5.0 01/04/2024 1750   GLUCOSEU NEGATIVE 01/04/2024 1750   HGBUR NEGATIVE 01/04/2024 1750   BILIRUBINUR NEGATIVE 01/04/2024 1750   KETONESUR NEGATIVE 01/04/2024 1750   PROTEINUR 30 (A) 01/04/2024 1750   NITRITE POSITIVE (A) 01/04/2024 1750   LEUKOCYTESUR LARGE (A) 01/04/2024 1750   Sepsis Labs: @LABRCNTIP (procalcitonin:4,lacticidven:4) Microbiology: Recent Results (from the past 240 hours)  Urine Culture (for pregnant, neutropenic or urologic patients or patients with an indwelling urinary catheter)     Status: Abnormal (Preliminary result)   Collection Time: 01/04/24  4:37 PM   Specimen: Urine, Random  Result Value Ref Range Status   Specimen Description   Final    URINE, RANDOM Performed at [facility], [address], [place], [place] [address]    Special Requests   Final    NONE Performed at [facility], [address] [place], [place] [address]    Culture (A)  Final    >=100,000 COLONIES/mL ESCHERICHIA COLI SUSCEPTIBILITIES TO FOLLOW Performed at [facility], [address], [place], [place] [address]    Report Status PENDING  Incomplete  Resp panel by RT-PCR (RSV, Flu A&B, Covid) Anterior Nasal Swab     Status: None   Collection Time: 01/04/24  4:49 PM   Specimen: Anterior Nasal Swab  Result Value Ref Range Status   SARS Coronavirus 2 by RT PCR NEGATIVE NEGATIVE Final    Comment: (NOTE) SARS-CoV-2 target nucleic acids are NOT DETECTED.  The SARS-CoV-2 RNA is generally detectable in upper respiratory specimens during the acute phase of infection. The lowest concentration of SARS-CoV-2 viral copies this assay can detect is 138 copies/mL. A negative result does not preclude SARS-Cov-2 infection and should not be used as the sole basis for treatment or other patient management decisions. A negative result may occur with  improper specimen collection/handling, submission of specimen other than nasopharyngeal swab, presence of viral mutation(s) within the areas targeted by this assay, and inadequate number of viral copies(<138 copies/mL). A negative result must be combined with clinical observations, patient history, and epidemiological information. The expected result is Negative.  Fact Sheet for Patients:  BloggerCourse.com  Fact Sheet for Healthcare Providers:  SeriousBroker.it  This test is no t yet approved or cleared by the [place]  FDA and  has been authorized for detection and/or diagnosis of SARS-CoV-2 by FDA under an Emergency Use Authorization (EUA). This EUA will remain  in effect (meaning this test can be used) for the duration of the COVID-19 declaration under Section  564(b)(1) of the Act, 21 U.S.C.section 360bbb-3(b)(1), unless the authorization is terminated  or revoked sooner.       Influenza A by PCR NEGATIVE NEGATIVE Final   Influenza B by PCR NEGATIVE NEGATIVE Final    Comment: (NOTE) The Xpert Xpress SARS-CoV-2/FLU/RSV plus assay is intended as an aid in the diagnosis of influenza from Nasopharyngeal swab specimens and should not be used as a sole basis for treatment. Nasal washings and aspirates are unacceptable for Xpert Xpress SARS-CoV-2/FLU/RSV testing.  Fact Sheet for Patients: BloggerCourse.com  Fact Sheet for Healthcare Providers: SeriousBroker.it  This test is not yet approved or cleared by the [place]  FDA and has been authorized for detection and/or diagnosis of SARS-CoV-2 by FDA under an Emergency Use Authorization (EUA). This EUA will remain in effect (meaning this test can be used) for the duration of the COVID-19 declaration under Section 564(b)(1) of the Act, 21 U.S.C. section 360bbb-3(b)(1), unless the authorization is terminated or revoked.     Resp Syncytial Virus by PCR NEGATIVE NEGATIVE Final    Comment: (NOTE) Fact Sheet for Patients: BloggerCourse.com  Fact Sheet for Healthcare Providers: SeriousBroker.it  This test is not yet approved or cleared by the [place]  FDA and has been authorized for detection and/or diagnosis of SARS-CoV-2 by FDA under an Emergency Use Authorization (EUA). This EUA will remain in effect (meaning this test can be used) for the duration of the COVID-19 declaration under Section 564(b)(1) of the Act, 21 U.S.C. section 360bbb-3(b)(1), unless the authorization is terminated or revoked.  Performed at  [facility], [address], [place], [place] [address]   MRSA Next Gen by PCR, Nasal     Status: None   Collection Time: 01/04/24  9:39 PM   Specimen: Nasal Mucosa;  Nasal Swab  Result Value Ref Range Status   MRSA by PCR Next Gen NOT DETECTED NOT DETECTED Final    Comment: (NOTE) The GeneXpert MRSA Assay (FDA approved for NASAL specimens only), is one component of a comprehensive MRSA colonization surveillance program. It is not intended to diagnose MRSA infection nor to guide or monitor treatment for MRSA infections. Test performance is not FDA approved in patients less than 28 years old. Performed at [facility], [address] [place], [place] [address]   Culture, blood (Routine X 2) w Reflex to ID Panel     Status: None (Preliminary result)   Collection Time: 01/05/24  8:32 AM   Specimen: BLOOD  Result Value Ref Range Status   Specimen Description BLOOD LEFT ANTECUBITAL  Final   Special Requests   Final    BOTTLES DRAWN AEROBIC AND ANAEROBIC Blood Culture adequate volume   Culture   Final    NO GROWTH 1 DAY Performed at [facility] [address] [place], [place] [address]    Report Status PENDING  Incomplete  Culture, blood (Routine X 2) w Reflex to ID Panel     Status: None (Preliminary result)   Collection Time: 01/05/24  8:39 AM   Specimen: BLOOD  Result Value Ref Range Status   Specimen Description BLOOD RIGHT ANTECUBITAL  Final   Special Requests   Final    BOTTLES DRAWN AEROBIC AND ANAEROBIC Blood Culture adequate volume   Culture   Final    NO GROWTH 1 DAY Performed at [facility], [address], [place], [place] [address]    Report Status PENDING  Incomplete      Radiology Studies last 3 days: DG Chest Port 1 View Result Date: 01/04/2024 CLINICAL DATA:  Shortness of breath.  History of colon cancer. EXAM: PORTABLE CHEST 1 VIEW COMPARISON:  None Available. FINDINGS: Right chest port tip in the right atrium. Volume loss in the right hemithorax with pleural effusion and opacity in the mid lower lung zone. The heart is grossly normal in size. Aortic atherosclerosis and tortuosity. Left lung  is grossly clear. No pneumothorax. The bones are subjectively under mineralized. IMPRESSION: Volume loss in the right hemithorax with pleural effusion and opacity in the mid lower lung zone. This may represent atelectasis, pneumonia, or neoplasm. Recommend correlation with any prior outside imaging. Electronically Signed   By: Andrea Gasman M.D.   On: 01/04/2024 17:26       Time spent: 50 min     Howard Bunte, DO [facility] 01/06/2024, 4:52 PM    Dictation software may have been used to generate the above note. Typos may occur and escape review in typed/dictated notes. Please contact Dr Marsa directly for clarity if needed.  Staff may message me via secure chat in Epic  but this may not receive an immediate response,  please page me for urgent matters!  If 7PM-7AM, please contact night coverage [email or web address]

## 2024-01-06 NOTE — Care Management Important Message (Signed)
 Important Message  Patient Details  Name: Shane [name] MRN: [id number] Date of Birth: 12-26-1950   Important Message Given:  Yes - Medicare IM     [name] 01/06/2024, 1:16 PM

## 2024-01-06 NOTE — Progress Notes (Signed)
 Initial Nutrition Assessment  DOCUMENTATION CODES:   Underweight, Severe malnutrition in context of chronic illness  INTERVENTION:   -Continue clear liquid diet; RD will follow for diet advancement and adjust supplement regimen as appropriate -Boost Breeze po TID, each supplement provides 250 kcal and 9 grams of protein  -30 ml Prosource Plus TID, each supplement provides 100 kcals and 15 grams protein -500 mg vitamin C BID -220 gm zinc sulfate daily -MVI with minerals daily  NUTRITION DIAGNOSIS:   Severe Malnutrition related to chronic illness (metastatic colon cancer) as evidenced by severe fat depletion, severe muscle depletion, percent weight loss.  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  Patient admitted with symptomatoic anemia and iron defienciy anemia.   Reviewed I/O's: +253 ml x 24 hours and +868 ml since admission  UOP: 750 ml x 24 hours  Ileostomy output: 1 L x 24 hours  Per RN notes, 400 ml of black, tarry stool found in ileostomy bag; GI has been consulted and recommending monitoring for now.   Per H&P, patient recently underwent a prolonged, month long hospitalization at [facility] for descending colon mass causing cecal obstruction. He underwent rt hemicolectomy and ileostomy.   He has metastatic disease to liver, lung, and perineum. 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 He had his first visit with [facility] on 12/17/13 and  tentative plan to start pembrolizumab on 01/01/24.   Per [facility] notes, he has a stage 4 pressure injury to sacrum, stage 3 pressure injury to right ischium, and partial thickness midline surgical wound (healing).   Spoke with Mr Chuba at bedside, who was pleasant and in good spirits today. He shares that he slept well last night, tolerating clear liquids well and is overall feeling better since admission. He was able to confirm above history and admits that he has been through a lot over the past few months. He states he has always eaten well, but does not follow a consistent meal schedule, stating he tries not to eat too much, especially at night as this causes him not to sleep well. He has minimal teeth, but denies any difficulty chewing or swallowing foods or liquids. He does not plan to obtain dentures, stating I ain't going out looking for no woman.   Mr Rudin endorses weight loss over the past few months due to cancer diagnosis (that's what happens when all of your nutrients go out of this bag). Over the past few weeks, he admits to steadily gaining weight; he states he was 148# at his oncology visit last week. Reviewed weight history; he was 198# on 11/26/2022. He has experienced a 25.4% weight loss over the past 11 months, which is significant for time frame.   Mr Eshbach shares that he knows that his health is poor and his time is limited; he desires to use the time he has left on his own terms. RD provided emotional support and encouraged him to think about his goals of care as well as his autonomy to make his  own medical decisions. Discussed importance of good meal and supplement intake to promote healing. RD discussed options of nutritional supplements, however, he refused stating those are women's drinks. RD discussed importance of calorie and protein to support recovery of acute illness and preserve lean body mass. He is hopeful for diet advancement soon. Of note, he was given an Ensure  supplement this morning after RD visit.   Medications reviewed and include vitamin C, ferrous sulfate, imodium, protonix, sodium chloride, and 0.9% sodium chloride infusion @ 75 ml/hr.   Labs reviewed: Na: 132, CBGS: 91-109 (inpatient orders for glycemic control are none).    NUTRITION - FOCUSED PHYSICAL EXAM:  Flowsheet Row Most Recent Value  Orbital Region Severe depletion  Upper Arm Region Severe depletion  Thoracic and Lumbar Region Severe depletion  Buccal Region Severe depletion  Temple Region Severe depletion  Clavicle Bone Region Severe depletion  Clavicle and Acromion Bone Region Severe depletion  Scapular Bone Region Severe depletion  Dorsal Hand Severe depletion  Patellar Region Severe depletion  Anterior Thigh Region Severe depletion  Posterior Calf Region Severe depletion  Edema (RD Assessment) None  Hair Reviewed  Eyes Reviewed  Mouth Reviewed  Skin Reviewed  Nails Reviewed    Diet Order:   Diet Order             Diet clear liquid Room service appropriate? Yes; Fluid consistency: Thin  Diet effective now                   EDUCATION NEEDS:   Education needs have been addressed  Skin:  Skin Assessment: Skin Integrity Issues: Skin Integrity Issues:: Incisions, Stage III, Stage IV Stage III: rt ischium Stage IV: sacrum Incisions: partial thickness midline surgical wound (healing)  Last BM:  01/06/24 (300 ml via ileostomy)  Height:   Ht Readings from Last 1 Encounters:  01/04/24 6' 5.01 (1.956 m)    Weight:   Wt Readings from Last 1 Encounters:  01/04/24 67.3 kg    Ideal Body Weight:  94.5 kg  BMI:  Body mass index is 17.59 kg/m.  Estimated Nutritional Needs:   Kcal:  7649-7449  Protein:  135-150 grams  Fluid:  2.0-2.2 L    Margery ORN, RD, LDN, CDCES Registered Dietitian III Certified Diabetes Care and Education Specialist If unable to reach this RD, please use RD Inpatient group chat on secure chat between hours of 8am-4  pm daily

## 2024-01-06 NOTE — Consult Note (Signed)
 WOC Nurse ostomy consult note Stoma type/location: RLQ, ileostomy  Stomal assessment/size: 1 round, prolapsed 3cm  Peristomal assessment: intact  Treatment options for stomal/peristomal skin: 2 barrier ring Output liquid green stool Ostomy pouching: 2pc. High output pouch placed 2 3/4  Education provided: patient resides in SNF Order Supplies: Gerlean # [phone] (high output pouch 2 3/4'/Lawson # 2 2 3/4 skin barrier/Lawson # [phone] barrier ring   Samari Gorby [facility], CNS, CWON-AP [phone]

## 2024-01-06 NOTE — Progress Notes (Signed)
 Shane Copping, MD [facility]   [address], [address] [place], [place] [address] Phone: [phone] Fax : [phone]   Subjective: This patient has a history of a descending colon mass causing obstruction that led to cecal perforation requiring a right hemicolectomy end ileostomy.  The patient was seen by Dr. Maryruth and now presented with anemia/melena.  The patient is on dual acting anticoagulation due to a PE.  It was noted by the nursing staff that there was dark output from the ostomy bag that has been going on since his surgery.  The patient also has a history of metastatic colon cancer and it was thought that the anemia is multifactorial including cancer infection and chronic inflammation.  It was recommended by Dr. Maryruth to transfuse if the hemoglobin goes less than 7 and to monitor for any GI bleeding.  He was also recommended to withhold his anticoagulation.  The patient's hemoglobin has been stable around 8.  2 days ago was much lower at 5.9.   Objective: Vital signs in last 24 hours: Vitals:   01/06/24 0035 01/06/24 0351 01/06/24 0756 01/06/24 1213  BP: 104/60 97/66 105/63 109/71  Pulse: 94 98 97 97  Resp: 18 18 18 20   Temp: 98.2 F (36.8 C) (!) 97.3 F (36.3 C) 98.4 F (36.9 C) 98.6 F (37 C)  TempSrc:      SpO2: 100% 100% 96% 100%  Weight:      Height:       Weight change:   Intake/Output Summary (Last 24 hours) at 01/06/2024 1452 Last data filed at 01/06/2024 1100 Gross per 24 hour  Intake 1537.5 ml  Output 1775 ml  Net -237.5 ml     Exam: General: Patient resting comfortably in bed in no apparent distress  Lab Results: @LABTES2 @ Micro Results: Recent Results (from the past 240 hours)  Urine Culture (for pregnant, neutropenic or urologic patients or patients with an indwelling urinary catheter)     Status: Abnormal (Preliminary result)   Collection Time: 01/04/24  4:37 PM   Specimen: Urine, Random  Result Value Ref Range Status   Specimen  Description   Final    URINE, RANDOM Performed at [facility], [address], [place], [place] [address]    Special Requests   Final    NONE Performed at [facility], [address] [place], [place] [address]    Culture (A)  Final    >=100,000 COLONIES/mL ESCHERICHIA COLI SUSCEPTIBILITIES TO FOLLOW Performed at [facility], [address] [place], [place] [address]    Report Status PENDING  Incomplete  Resp panel by RT-PCR (RSV, Flu A&B, Covid) Anterior Nasal Swab     Status: None   Collection Time: 01/04/24  4:49 PM   Specimen: Anterior Nasal Swab  Result Value Ref Range Status   SARS Coronavirus 2 by RT PCR NEGATIVE NEGATIVE Final    Comment: (NOTE) SARS-CoV-2 target nucleic acids are NOT DETECTED.  The SARS-CoV-2 RNA is generally detectable in upper respiratory specimens during the acute phase of infection. The lowest concentration of SARS-CoV-2 viral copies this assay can detect is 138 copies/mL. A negative result does not preclude SARS-Cov-2 infection and should not be used as the sole basis for treatment or other patient management decisions. A negative result may occur with  improper specimen collection/handling, submission of specimen other than nasopharyngeal swab, presence of viral mutation(s) within the areas targeted by this assay, and inadequate number of viral copies(<138 copies/mL). A  negative result must be combined with clinical observations, patient history, and epidemiological information. The expected result is Negative.  Fact Sheet for Patients:  BloggerCourse.com  Fact Sheet for Healthcare Providers:  SeriousBroker.it  This test is no t yet approved or cleared by the [place]  FDA and  has been authorized for detection and/or diagnosis of SARS-CoV-2 by FDA under an Emergency Use Authorization (EUA). This EUA will remain  in effect (meaning this test can be used) for  the duration of the COVID-19 declaration under Section 564(b)(1) of the Act, 21 U.S.C.section 360bbb-3(b)(1), unless the authorization is terminated  or revoked sooner.       Influenza A by PCR NEGATIVE NEGATIVE Final   Influenza B by PCR NEGATIVE NEGATIVE Final    Comment: (NOTE) The Xpert Xpress SARS-CoV-2/FLU/RSV plus assay is intended as an aid in the diagnosis of influenza from Nasopharyngeal swab specimens and should not be used as a sole basis for treatment. Nasal washings and aspirates are unacceptable for Xpert Xpress SARS-CoV-2/FLU/RSV testing.  Fact Sheet for Patients: BloggerCourse.com  Fact Sheet for Healthcare Providers: SeriousBroker.it  This test is not yet approved or cleared by the [place]  FDA and has been authorized for detection and/or diagnosis of SARS-CoV-2 by FDA under an Emergency Use Authorization (EUA). This EUA will remain in effect (meaning this test can be used) for the duration of the COVID-19 declaration under Section 564(b)(1) of the Act, 21 U.S.C. section 360bbb-3(b)(1), unless the authorization is terminated or revoked.     Resp Syncytial Virus by PCR NEGATIVE NEGATIVE Final    Comment: (NOTE) Fact Sheet for Patients: BloggerCourse.com  Fact Sheet for Healthcare Providers: SeriousBroker.it  This test is not yet approved or cleared by the [place]  FDA and has been authorized for detection and/or diagnosis of SARS-CoV-2 by FDA under an Emergency Use Authorization (EUA). This EUA will remain in effect (meaning this test can be used) for the duration of the COVID-19 declaration under Section 564(b)(1) of the Act, 21 U.S.C. section 360bbb-3(b)(1), unless the authorization is terminated or revoked.  Performed at [facility], [address] [place], [place] [address]   MRSA Next Gen by PCR, Nasal     Status: None    Collection Time: 01/04/24  9:39 PM   Specimen: Nasal Mucosa; Nasal Swab  Result Value Ref Range Status   MRSA by PCR Next Gen NOT DETECTED NOT DETECTED Final    Comment: (NOTE) The GeneXpert MRSA Assay (FDA approved for NASAL specimens only), is one component of a comprehensive MRSA colonization surveillance program. It is not intended to diagnose MRSA infection nor to guide or monitor treatment for MRSA infections. Test performance is not FDA approved in patients less than 81 years old. Performed at [facility], [address] [place], [place] [address]   Culture, blood (Routine X 2) w Reflex to ID Panel     Status: None (Preliminary result)   Collection Time: 01/05/24  8:32 AM   Specimen: BLOOD  Result Value Ref Range Status   Specimen Description BLOOD LEFT ANTECUBITAL  Final   Special Requests   Final    BOTTLES DRAWN AEROBIC AND ANAEROBIC Blood Culture adequate volume   Culture   Final    NO GROWTH 1 DAY Performed at [facility], [address], [place], [place] [address]    Report Status PENDING  Incomplete  Culture, blood (Routine X 2) w Reflex to ID Panel     Status: None (Preliminary result)  Collection Time: 01/05/24  8:39 AM   Specimen: BLOOD  Result Value Ref Range Status   Specimen Description BLOOD RIGHT ANTECUBITAL  Final   Special Requests   Final    BOTTLES DRAWN AEROBIC AND ANAEROBIC Blood Culture adequate volume   Culture   Final    NO GROWTH 1 DAY Performed at [facility], [address], [place], [place] [address]    Report Status PENDING  Incomplete   Studies/Results: DG Chest Port 1 View Result Date: 01/04/2024 CLINICAL DATA:  Shortness of breath.  History of colon cancer. EXAM: PORTABLE CHEST 1 VIEW COMPARISON:  None Available. FINDINGS: Right chest port tip in the right atrium. Volume loss in the right hemithorax with pleural effusion and opacity in the mid lower lung zone. The heart is grossly normal in size. Aortic  atherosclerosis and tortuosity. Left lung is grossly clear. No pneumothorax. The bones are subjectively under mineralized. IMPRESSION: Volume loss in the right hemithorax with pleural effusion and opacity in the mid lower lung zone. This may represent atelectasis, pneumonia, or neoplasm. Recommend correlation with any prior outside imaging. Electronically Signed   By: Andrea Gasman M.D.   On: 01/04/2024 17:26   Medications: I have reviewed the patient's current medications. Scheduled Meds:  (feeding supplement) PROSource Plus  30 mL Oral TID BM   ascorbic acid  500 mg Oral BID   Chlorhexidine Gluconate Cloth  6 each Topical Daily   feeding supplement  1 Container Oral TID BM   ferrous sulfate  325 mg Oral Q12H   loperamide  2 mg Oral QID   melatonin  2.5 mg Oral QHS   multivitamin with minerals  1 tablet Oral Daily   nicotine  21 mg Transdermal Daily   pantoprazole (PROTONIX) IV  40 mg Intravenous Q12H   sodium chloride flush  10-40 mL Intracatheter Q12H   sodium chloride  1 g Oral BID WC   zinc sulfate (50mg  elemental zinc)  220 mg Oral Daily   Continuous Infusions:  sodium chloride 75 mL/hr at 01/06/24 0543   cefTRIAXone (ROCEPHIN)  IV 1 g (01/05/24 1758)   PRN Meds:.acetaminophen , albuterol, alum & mag hydroxide-simeth, dextromethorphan-guaiFENesin, ondansetron (ZOFRAN) IV, sodium chloride flush   Assessment: Principal Problem:   Symptomatic anemia Active Problems:   Iron deficiency anemia   Pulmonary embolism (HCC)   GERD (gastroesophageal reflux disease)   Colon cancer (HCC)   Tobacco use disorder   UTI (urinary tract infection)   DVT (deep venous thrombosis)_REU   COPD (chronic obstructive pulmonary disease) (HCC)   Pressure injury of skin   Protein calorie malnutrition   Thrombocytosis   Hyponatremia   GI bleeding    Plan: This patient came in with anemia and an elevated white cell count at 20.0 that went up to 25.1 and is now down to 15.6.  The patient has  metastatic colon cancer and his anemia is thought to be multifactorial including infection, from his cancer and chronic inflammation.  With the patient's hemoglobin being stable I would defer any procedures at this time.  I do  endorsed Dr. Calton request for infectious workup monitor hemoglobin and transfuse for hemoglobin below 7 PPI and a palliative care consult.   LOS: 2 days   Shane Copping, MD.FACG 01/06/2024, 2:52 PM Pager [phone] 7am-5pm  Check AMION for 5pm -7am coverage and on weekends

## 2024-01-07 DIAGNOSIS — D649 Anemia, unspecified: Secondary | ICD-10-CM | POA: Diagnosis not present

## 2024-01-07 LAB — CBC
HCT: 23.8 % — ABNORMAL LOW (ref 39.0–52.0)
Hemoglobin: 7.6 g/dL — ABNORMAL LOW (ref 13.0–17.0)
MCH: 27.4 pg (ref 26.0–34.0)
MCHC: 31.9 g/dL (ref 30.0–36.0)
MCV: 85.9 fL (ref 80.0–100.0)
Platelets: 1340 K/uL (ref 150–400)
RBC: 2.77 MIL/uL — ABNORMAL LOW (ref 4.22–5.81)
RDW: 19.3 % — ABNORMAL HIGH (ref 11.5–15.5)
WBC: 14.4 K/uL — ABNORMAL HIGH (ref 4.0–10.5)
nRBC: 0 % (ref 0.0–0.2)

## 2024-01-07 LAB — URINE CULTURE: Culture: >=100000 — AB

## 2024-01-07 LAB — GLUCOSE, CAPILLARY: Glucose-Capillary: 99 mg/dL (ref 70–99)

## 2024-01-07 MED ORDER — OXYCODONE HCL 5 MG PO TABS
5.0000 mg | ORAL_TABLET | ORAL | Status: DC | PRN
  Administered 2024-01-09 – 2024-01-11 (×4): 5 mg via ORAL
  Filled 2024-01-07 (×4): qty 1

## 2024-01-07 MED ORDER — CEFADROXIL 500 MG PO CAPS
500.0000 mg | ORAL_CAPSULE | Freq: Two times a day (BID) | ORAL | Status: AC
  Administered 2024-01-07 – 2024-01-09 (×5): 500 mg via ORAL
  Filled 2024-01-07 (×6): qty 1

## 2024-01-07 NOTE — [place] FL2 (Signed)
 [place]  MEDICAID FL2 LEVEL OF CARE FORM     IDENTIFICATION  Patient Name: Shane [name] Birthdate: November 29, 1950 Sex: male Admission Date (Current Location): 01/04/2024  [place] and [place] Number:  Chiropodist and Address:  [facility], [address], [place], [place] [address]      Provider Number: [id number]  Attending Physician Name and Address:  Marsa Edelman, DO  Relative Name and Phone Number:  Cheskel Silverio [phone]    Current Level of Care: SNF Recommended Level of Care: Skilled Nursing Facility Prior Approval Number:    Date Approved/Denied:   PASRR Number: [id number]  Discharge Plan: SNF    Current Diagnoses: Patient Active Problem List   Diagnosis Date Noted   Protein-calorie malnutrition, severe 01/06/2024   GI bleeding 01/05/2024   Symptomatic anemia 01/04/2024   UTI (urinary tract infection) 01/04/2024   DVT (deep venous thrombosis)_REU 01/04/2024   Pressure injury of skin 01/04/2024   Protein calorie malnutrition 01/04/2024   Thrombocytosis 01/04/2024   Hyponatremia 01/04/2024   Iron deficiency anemia    Pulmonary embolism (HCC)    GERD (gastroesophageal reflux disease)    Colon cancer (HCC)    Tobacco use disorder    COPD (chronic obstructive pulmonary disease) (HCC)     Orientation RESPIRATION BLADDER Height & Weight     Self, Time, Situation, Place  Normal Indwelling catheter Weight: 148 lb 5.9 oz (67.3 kg) Height:  6' 5.01 (195.6 cm)  BEHAVIORAL SYMPTOMS/MOOD NEUROLOGICAL BOWEL NUTRITION STATUS      Colostomy    AMBULATORY STATUS COMMUNICATION OF NEEDS Skin   Limited Assist   Normal                       Personal Care Assistance Level of Assistance              Functional Limitations Info             SPECIAL CARE FACTORS FREQUENCY  PT (By licensed PT), OT (By licensed OT)     PT Frequency: 5x OT Frequency: 5x            Contractures Contractures Info: Not  present    Additional Factors Info                  Current Medications (01/07/2024):  This is the current hospital active medication list Current Facility-Administered Medications  Medication Dose Route Frequency Provider Last Rate Last Admin   (feeding supplement) PROSource Plus liquid 30 mL  30 mL Oral TID BM Alexander, Natalie, DO   30 mL at 01/07/24 9078   acetaminophen  (TYLENOL ) tablet 650 mg  650 mg Oral Q6H PRN Niu, Xilin, MD   650 mg at 01/07/24 9787   albuterol (PROVENTIL) (2.5 MG/3ML) 0.083% nebulizer solution 2.5 mg  2.5 mg Inhalation Q4H PRN Niu, Xilin, MD       alum & mag hydroxide-simeth (MAALOX/MYLANTA) 200-200-20 MG/5ML suspension 30 mL  30 mL Oral Q4H PRN Niu, Xilin, MD       ascorbic acid (VITAMIN C) tablet 500 mg  500 mg Oral BID Alexander, Natalie, DO   500 mg at 01/07/24 0856   cefTRIAXone (ROCEPHIN) 1 g in sodium chloride 0.9 % 100 mL IVPB  1 g Intravenous Q24H Niu, Xilin, MD 200 mL/hr at 01/06/24 1808 1 g at 01/06/24 1808   Chlorhexidine Gluconate Cloth 2 % PADS 6 each  6 each Topical Daily Niu, Xilin, MD   6  each at 01/07/24 9078   dextromethorphan-guaiFENesin (MUCINEX DM) 30-600 MG per 12 hr tablet 1 tablet  1 tablet Oral BID PRN Niu, Xilin, MD       feeding supplement (BOOST / RESOURCE BREEZE) liquid 1 Container  1 Container Oral TID BM Alexander, Natalie, DO   1 Container at 01/07/24 0921   ferrous sulfate tablet 325 mg  325 mg Oral Q12H Niu, Xilin, MD   325 mg at 01/07/24 0855   loperamide (IMODIUM) capsule 2 mg  2 mg Oral QID Niu, Xilin, MD   2 mg at 01/07/24 9143   melatonin tablet 2.5 mg  2.5 mg Oral QHS Niu, Xilin, MD   2.5 mg at 01/06/24 2129   multivitamin with minerals tablet 1 tablet  1 tablet Oral Daily Alexander, Natalie, DO   1 tablet at 01/07/24 0855   nicotine (NICODERM CQ - dosed in mg/24 hours) patch 21 mg  21 mg Transdermal Daily Niu, Xilin, MD   21 mg at 01/07/24 0859   ondansetron (ZOFRAN) injection 4 mg  4 mg Intravenous Q8H PRN Niu, Xilin,  MD       pantoprazole (PROTONIX) injection 40 mg  40 mg Intravenous Q12H Niu, Xilin, MD   40 mg at 01/07/24 0856   sodium chloride flush (NS) 0.9 % injection 10-40 mL  10-40 mL Intracatheter Q12H Niu, Xilin, MD   10 mL at 01/07/24 0921   sodium chloride flush (NS) 0.9 % injection 10-40 mL  10-40 mL Intracatheter PRN Niu, Xilin, MD       sodium chloride tablet 1 g  1 g Oral BID WC Niu, Xilin, MD   1 g at 01/07/24 0855   zinc sulfate (50mg  elemental zinc) capsule 220 mg  220 mg Oral Daily Alexander, Natalie, DO   220 mg at 01/07/24 9143     Discharge Medications: Please see discharge summary for a list of discharge medications.  Relevant Imaging Results:  Relevant Lab Results:   Additional Information [id number]  Alfonso Rummer, LCSW

## 2024-01-07 NOTE — TOC Initial Note (Signed)
 Transition of Care [facility]) - Initial/Assessment Note    Patient Details  Name: Shane [name] MRN: [id number] Date of Birth: 11/18/1950  Transition [facility]) CM/SW Contact:    Alfonso Rummer, LCSW Phone Number: 01/07/2024, 12:33 PM  Clinical Narrative:                 Pt is skilled nursing resident with [employer]. LCSW A. Rummer spk with [facility] admissions rep reports pt bed is available 01/07/25. Cash FL2 is completed and Massie reports he will completed insurance authorization. TOC will continue to follow to ensure safe discharge.         Patient Goals and CMS Choice            Expected Discharge Plan and Services        [facility]                                       Prior Living Arrangements/Services                       Activities of Daily Living   ADL Screening (condition at time of admission) Independently performs ADLs?: Yes (appropriate for developmental age) Is the patient deaf or have difficulty hearing?: No Does the patient have difficulty seeing, even when wearing glasses/contacts?: No Does the patient have difficulty concentrating, remembering, or making decisions?: No  Permission Sought/Granted                  Emotional Assessment              Admission diagnosis:  Symptomatic anemia [D64.9] Anemia, unspecified type [D64.9] Patient Active Problem List   Diagnosis Date Noted   Protein-calorie malnutrition, severe 01/06/2024   GI bleeding 01/05/2024   Symptomatic anemia 01/04/2024   UTI (urinary tract infection) 01/04/2024   DVT (deep venous thrombosis)_REU 01/04/2024   Pressure injury of skin 01/04/2024   Protein calorie malnutrition 01/04/2024   Thrombocytosis 01/04/2024   Hyponatremia 01/04/2024   Iron deficiency anemia    Pulmonary embolism (HCC)    GERD (gastroesophageal reflux disease)    Colon cancer (HCC)    Tobacco use disorder    COPD (chronic obstructive  pulmonary disease) (HCC)    PCP:  Patient, No Pcp Per Pharmacy:   [id number] [place], [place] - [address] AT [facility] [address] [place] [address] Phone: [phone] Fax: [phone]     Social Drivers of Health (SDOH) Social History: SDOH Screenings   Food Insecurity: No Food Insecurity (11/04/2023)   [facility] from [facility]  Housing: Unknown (01/05/2024)  Transportation Needs: No Transportation Needs (01/05/2024)  Utilities: Not At Risk (01/05/2024)  Financial Resource Strain: Low Risk  (11/04/2023)   Received from [facility]  Physical Activity: Sufficiently Active (04/22/2018)  Social Connections: Unknown (01/05/2024)  Stress: No Stress Concern Present (04/22/2018)  Tobacco Use: High Risk (01/04/2024)   SDOH Interventions:     Readmission Risk Interventions     No data to display

## 2024-01-07 NOTE — Plan of Care (Signed)

## 2024-01-07 NOTE — Progress Notes (Signed)
 Made in error  Communication with daughter via MyChart.

## 2024-01-07 NOTE — Progress Notes (Addendum)
 PROGRESS NOTE    Shane [name]   FMW:[id number] DOB: 06-15-1950  DOA: 01/04/2024 Date of Service: 01/07/24 which is hospital day 3  PCP: Patient, No Pcp Per    Hospital course / significant events:   Shane [name] is a 73 y.o. 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 Presented to ED w/ weakness and lightheadedness   HPI: He had recent month long hospitalization at [facility] due to descending colon mass causing obstruction that led to cecal perforation requiring right hemicolectomy and ileostomy.   10/18: Hgb 5.9 in ED, also UA concern for UTI. Was admitted to hospitalist service, also concern for dark bloody stool in ostomy. See A/P. 10/19: GI consult - monitor for now. Hgb remains stable 10/20: no further bleeding, advancing diet. Ask TOC to confirm he can go back to facility 10/21: were hoping to get him back to [facility] today but they don't have bed (daughter mistakenly gave it up, miscommunication led to having to start auth all over for rehab, see TOC). At any rate, pt is stable.      Consultants:  Gastroenterology  Procedures/Surgeries: none      ASSESSMENT & PLAN:   Symptomatic anemia  Chronic iron deficiency anemia ABLA GI bleed in setting of metastatic cancer, Eliquis  S/p 2 units PRBC IV pantoprazole   Zofran IV for nausea Avoid NSAIDs and SQ heparin Maintain IV access (2 large bore IVs if possible). 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 Follow-up with oncology  he was supposed to see onc 10/22 for repeat CT chest/abd/pelv, Pembrolizumab first dose. Obviously he is here. Please reach out to his onc team below to alert them he is here and if they want us  to do CT while he is here or not  (daughter is asking for it here if he's here). Could probably do CT to assess mass(es) risk of bleed, but also issues w/ transferring images to [facility] docs may be a hindrance to their follow up so would ask them if they want us  to do it here / wait / ask when they can reschedule him. Sorah, Dorn Lenis, MD - [facility] - Phone: tel:[phone]  Possible UTI (urinary tract infection): Patient has positive UA, denies symptoms UTI.  Since patient has leukocytosis WBC 20.1, will antibiotics empirically. IV Rocephin   History of pulmonary embolism (HCC) and DVT (deep venous thrombosis) Need to hold Eliquis in setting of bleed - daughter is agreeable to this and prefers to hold indefinitely   GERD (gastroesophageal reflux disease) On Protonix   COPD (chronic obstructive pulmonary disease)  Patient has mild SOB and mild cough with little white mucus production, no fever, clinically does not have pneumonia.  No oxygen desaturation. Bronchodilators  as needed Mucinex   Tobacco use disorder nicotine patch   Thrombocytosis Platelets 1405, likely reactive. Check LDH and peripheral smear   Pressure injury of skin stage IV sacral ulcer POA Wound care   Protein calorie malnutrition, severe  Ensure Consult dietician.   Hyponatremia:  Sodium 129, mental status normal.   Likely due to poor oral intake and dehydration, metastatic disease . Monitor BMP  Advanced care planning DNR with NO chest compressions but OK for cardioversion, intubation/ventilation.  The descending colon mass is still  present and on CT imaging from [facility], he may have a fistula between the colon mass and the small bowel. He has widely metastatic disease including liver, lungs, and peritoneum. I have discussed w/ patient and daughter the high risk of further bleed, which may be unpredictable and would likely not be able to be stopped, at which point should consider comfort measures in the likely case that there is no  treatment which could fix underlying reason for the bleed.  Would strongly recommend palliative care follow up outpatient    Underweight based on BMI: Body mass index is 17.59 kg/m.Shane [name] Significantly low or high BMI is associated with higher medical risk.  Underweight - under 18  overweight - 25 to 29 obese - 30 or more Class 1 obesity: BMI of 30.0 to 34 Class 2 obesity: BMI of 35.0 to 39 Class 3 obesity: BMI of 40.0 to 49 Super Morbid Obesity: BMI 50-59 Super-super Morbid Obesity: BMI 60+ Healthy nutrition and physical activity advised as adjunct to other disease management and risk reduction treatments    DVT prophylaxis: SCD w/ GIB and recent anticoagulation IV fluids:  continuous IV fluids can dc since toelrating  Nutrition: CLD --> regular  Central lines / other devices: ileostomy  Code Status: NO chest compressions ACP documentation reviewed:  none on file in [facility]  TOC needs: Back to SNF when able Medical barriers to dispo: none             Subjective / Brief ROS:  Patient reports feeling ok today Denies CP/SOB.  Pain controlled.  Denies new weakness..    Family Communication: spoke w/ daughter at bedside today     Objective Findings:  Vitals:   01/07/24 0550 01/07/24 0739 01/07/24 1159 01/07/24 1751  BP: 109/75 113/75 123/83 122/81  Pulse: 85 88 94 95  Resp: 17  16 18   Temp: 98.3 F (36.8 C) 98 F (36.7 C) 98.2 F (36.8 C) 98.4 F (36.9 C)  TempSrc:    Oral  SpO2: 99% 100% 100% 100%  Weight:      Height:        Intake/Output Summary (Last 24 hours) at 01/07/2024 1819 Last data filed at 01/07/2024 0600 Gross per 24 hour  Intake 200 ml  Output 1450 ml  Net -1250 ml   Filed Weights   01/04/24 2100  Weight: 67.3 kg    Examination:  Physical Exam Constitutional:      General: He is not in acute distress.    Appearance: He is ill-appearing.  Cardiovascular:     Rate and Rhythm: Normal rate and regular rhythm.     Heart sounds:  Murmur heard.  Pulmonary:     Breath sounds: Normal breath sounds.  Abdominal:     General: Bowel sounds are normal.     Palpations: Abdomen is soft.  Musculoskeletal:     Right lower leg: No edema.     Left lower leg: No edema.  Neurological:     Mental Status: He is alert and oriented to person, place, and time.  Psychiatric:        Mood and Affect: Mood normal.        Behavior: Behavior normal.          Scheduled Medications:   (feeding supplement) PROSource Plus  30 mL Oral TID BM   ascorbic acid  500 mg Oral BID   cefadroxil  500 mg Oral BID   Chlorhexidine Gluconate Cloth  6 each Topical Daily  feeding supplement  1 Container Oral TID BM   ferrous sulfate  325 mg Oral Q12H   loperamide  2 mg Oral QID   melatonin  2.5 mg Oral QHS   multivitamin with minerals  1 tablet Oral Daily   nicotine  21 mg Transdermal Daily   pantoprazole (PROTONIX) IV  40 mg Intravenous Q12H   sodium chloride flush  10-40 mL Intracatheter Q12H   sodium chloride  1 g Oral BID WC   zinc sulfate (50mg  elemental zinc)  220 mg Oral Daily    Continuous Infusions:    PRN Medications:  acetaminophen , albuterol, alum & mag hydroxide-simeth, dextromethorphan-guaiFENesin, ondansetron (ZOFRAN) IV, oxyCODONE, sodium chloride flush  Antimicrobials from admission:  Anti-infectives (From admission, onward)    Start     Dose/Rate Route Frequency Ordered Stop   01/07/24 1800  cefadroxil (DURICEF) capsule 500 mg        500 mg Oral 2 times daily 01/07/24 1247 01/10/24 0959   01/04/24 1930  cefTRIAXone (ROCEPHIN) 1 g in sodium chloride 0.9 % 100 mL IVPB  Status:  Discontinued        1 g 200 mL/hr over 30 Minutes Intravenous Every 24 hours 01/04/24 1919 01/07/24 1247           Data Reviewed:  I have personally reviewed the following...  CBC: Recent Labs  Lab 01/04/24 1636 01/05/24 0530 01/05/24 1300 01/05/24 2258 01/06/24 0843 01/06/24 1148 01/07/24 0458  WBC 20.1*   < > 22.9* 18.4*  16.6* 15.6* 14.4*  NEUTROABS 17.1*  --   --   --   --   --   --   HGB 5.9*   < > 8.4* 7.9* 7.8* 8.0* 7.6*  HCT 18.8*   < > 25.6* 24.0* 24.3* 25.1* 23.8*  MCV 82.1   < > 84.5 84.5 85.6 85.4 85.9  PLT 1,405*   < > 1,334* 1,292* 1,352* 1,374* 1,340*   < > = values in this interval not displayed.   Basic Metabolic Panel: Recent Labs  Lab 01/04/24 1636 01/05/24 0530 01/05/24 0832 01/05/24 1443 01/05/24 2258  NA 129* 133* 131* 133* 132*  K 4.8 3.8 3.9 3.8 3.8  CL 97* 98 97* 97* 98  CO2 22 20* 21* 21* 22  GLUCOSE 100* 117* 111* 101* 105*  BUN 13 12 12 13 12   CREATININE 0.67 0.67 0.67 0.56* 0.69  CALCIUM 8.6* 8.5* 8.4* 8.6* 8.4*  MG  --  1.6*  --   --   --   PHOS  --  4.1  --   --   --    GFR: Estimated Creatinine Clearance: 75.9 mL/min (by C-G formula based on SCr of 0.69 mg/dL). Liver Function Tests: Recent Labs  Lab 01/04/24 1636  AST 22  ALT 9  ALKPHOS 85  BILITOT 0.8  PROT 7.5  ALBUMIN 2.3*   No results for input(s): LIPASE, AMYLASE in the last 168 hours. No results for input(s): AMMONIA in the last 168 hours. Coagulation Profile: Recent Labs  Lab 01/05/24 0530  INR 1.2   Cardiac Enzymes: No results for input(s): CKTOTAL, CKMB, CKMBINDEX, TROPONINI in the last 168 hours. BNP (last 3 results) No results for input(s): PROBNP in the last 8760 hours. HbA1C: No results for input(s): HGBA1C in the last 72 hours. CBG: Recent Labs  Lab 01/05/24 0902 01/06/24 0753 01/07/24 0738  GLUCAP 109* 91 99   Lipid Profile: No results for input(s): CHOL, HDL, LDLCALC, TRIG, CHOLHDL, LDLDIRECT in the last  72 hours. Thyroid Function Tests: No results for input(s): TSH, T4TOTAL, FREET4, T3FREE, THYROIDAB in the last 72 hours. Anemia Panel: No results for input(s): VITAMINB12, FOLATE, FERRITIN, TIBC, IRON, RETICCTPCT in the last 72 hours. Most Recent Urinalysis On File:     Component Value Date/Time   COLORURINE YELLOW  (A) 01/04/2024 1750   APPEARANCEUR CLOUDY (A) 01/04/2024 1750   LABSPEC 1.015 01/04/2024 1750   PHURINE 5.0 01/04/2024 1750   GLUCOSEU NEGATIVE 01/04/2024 1750   HGBUR NEGATIVE 01/04/2024 1750   BILIRUBINUR NEGATIVE 01/04/2024 1750   KETONESUR NEGATIVE 01/04/2024 1750   PROTEINUR 30 (A) 01/04/2024 1750   NITRITE POSITIVE (A) 01/04/2024 1750   LEUKOCYTESUR LARGE (A) 01/04/2024 1750   Sepsis Labs: @LABRCNTIP (procalcitonin:4,lacticidven:4) Microbiology: Recent Results (from the past 240 hours)  Urine Culture (for pregnant, neutropenic or urologic patients or patients with an indwelling urinary catheter)     Status: Abnormal   Collection Time: 01/04/24  4:37 PM   Specimen: Urine, Random  Result Value Ref Range Status   Specimen Description   Final    URINE, RANDOM Performed at [facility], [address], [place], [place] [address]    Special Requests   Final    NONE Performed at [facility], [address] [place], [place] [address]    Culture >=100,000 COLONIES/mL ESCHERICHIA COLI (A)  Final   Report Status 01/07/2024 FINAL  Final   Organism ID, Bacteria ESCHERICHIA COLI (A)  Final      Susceptibility   Escherichia coli - MIC*    AMPICILLIN >=32 RESISTANT Resistant     CEFAZOLIN (URINE) Value in next row Sensitive      8 SENSITIVEThis is a modified FDA-approved test that has been validated and its performance characteristics determined by the reporting laboratory.  This laboratory is certified under the Clinical Laboratory Improvement Amendments CLIA as qualified to perform high complexity clinical laboratory testing.    CEFEPIME Value in next row Sensitive      8 SENSITIVEThis is a modified FDA-approved test that has been validated and its performance characteristics determined by the reporting laboratory.  This laboratory is certified under the Clinical Laboratory Improvement Amendments CLIA as qualified to perform high complexity clinical laboratory  testing.    ERTAPENEM Value in next row Sensitive      8 SENSITIVEThis is a modified FDA-approved test that has been validated and its performance characteristics determined by the reporting laboratory.  This laboratory is certified under the Clinical Laboratory Improvement Amendments CLIA as qualified to perform high complexity clinical laboratory testing.    CEFTRIAXONE Value in next row Sensitive      8 SENSITIVEThis is a modified FDA-approved test that has been validated and its performance characteristics determined by the reporting laboratory.  This laboratory is certified under the Clinical Laboratory Improvement Amendments CLIA as qualified to perform high complexity clinical laboratory testing.    CIPROFLOXACIN Value in next row Sensitive      8 SENSITIVEThis is a modified FDA-approved test that has been validated and its performance characteristics determined by the reporting laboratory.  This laboratory is certified under the Clinical Laboratory Improvement Amendments CLIA as qualified to perform high complexity clinical laboratory testing.    GENTAMICIN Value in next row Sensitive      8 SENSITIVEThis is a modified FDA-approved test that has been validated and its performance characteristics determined by the reporting laboratory.  This laboratory is certified under the Clinical Laboratory Improvement Amendments CLIA as qualified to perform high complexity  clinical laboratory testing.    NITROFURANTOIN Value in next row Sensitive      8 SENSITIVEThis is a modified FDA-approved test that has been validated and its performance characteristics determined by the reporting laboratory.  This laboratory is certified under the Clinical Laboratory Improvement Amendments CLIA as qualified to perform high complexity clinical laboratory testing.    TRIMETH/SULFA Value in next row Resistant      8 SENSITIVEThis is a modified FDA-approved test that has been validated and its performance characteristics  determined by the reporting laboratory.  This laboratory is certified under the Clinical Laboratory Improvement Amendments CLIA as qualified to perform high complexity clinical laboratory testing.    AMPICILLIN/SULBACTAM Value in next row Resistant      8 SENSITIVEThis is a modified FDA-approved test that has been validated and its performance characteristics determined by the reporting laboratory.  This laboratory is certified under the Clinical Laboratory Improvement Amendments CLIA as qualified to perform high complexity clinical laboratory testing.    PIP/TAZO Value in next row Sensitive      8 SENSITIVEThis is a modified FDA-approved test that has been validated and its performance characteristics determined by the reporting laboratory.  This laboratory is certified under the Clinical Laboratory Improvement Amendments CLIA as qualified to perform high complexity clinical laboratory testing.    MEROPENEM Value in next row Sensitive      8 SENSITIVEThis is a modified FDA-approved test that has been validated and its performance characteristics determined by the reporting laboratory.  This laboratory is certified under the Clinical Laboratory Improvement Amendments CLIA as qualified to perform high complexity clinical laboratory testing.    * >=100,000 COLONIES/mL ESCHERICHIA COLI  Resp panel by RT-PCR (RSV, Flu A&B, Covid) Anterior Nasal Swab     Status: None   Collection Time: 01/04/24  4:49 PM   Specimen: Anterior Nasal Swab  Result Value Ref Range Status   SARS Coronavirus 2 by RT PCR NEGATIVE NEGATIVE Final    Comment: (NOTE) SARS-CoV-2 target nucleic acids are NOT DETECTED.  The SARS-CoV-2 RNA is generally detectable in upper respiratory specimens during the acute phase of infection. The lowest concentration of SARS-CoV-2 viral copies this assay can detect is 138 copies/mL. A negative result does not preclude SARS-Cov-2 infection and should not be used as the sole basis for treatment  or other patient management decisions. A negative result may occur with  improper specimen collection/handling, submission of specimen other than nasopharyngeal swab, presence of viral mutation(s) within the areas targeted by this assay, and inadequate number of viral copies(<138 copies/mL). A negative result must be combined with clinical observations, patient history, and epidemiological information. The expected result is Negative.  Fact Sheet for Patients:  BloggerCourse.com  Fact Sheet for Healthcare Providers:  SeriousBroker.it  This test is no t yet approved or cleared by the [place]  FDA and  has been authorized for detection and/or diagnosis of SARS-CoV-2 by FDA under an Emergency Use Authorization (EUA). This EUA will remain  in effect (meaning this test can be used) for the duration of the COVID-19 declaration under Section 564(b)(1) of the Act, 21 U.S.C.section 360bbb-3(b)(1), unless the authorization is terminated  or revoked sooner.       Influenza A by PCR NEGATIVE NEGATIVE Final   Influenza B by PCR NEGATIVE NEGATIVE Final    Comment: (NOTE) The Xpert Xpress SARS-CoV-2/FLU/RSV plus assay is intended as an aid in the diagnosis of influenza from Nasopharyngeal swab specimens and should not be used as a sole  basis for treatment. Nasal washings and aspirates are unacceptable for Xpert Xpress SARS-CoV-2/FLU/RSV testing.  Fact Sheet for Patients: BloggerCourse.com  Fact Sheet for Healthcare Providers: SeriousBroker.it  This test is not yet approved or cleared by the [place]  FDA and has been authorized for detection and/or diagnosis of SARS-CoV-2 by FDA under an Emergency Use Authorization (EUA). This EUA will remain in effect (meaning this test can be used) for the duration of the COVID-19 declaration under Section 564(b)(1) of the Act, 21 U.S.C. section  360bbb-3(b)(1), unless the authorization is terminated or revoked.     Resp Syncytial Virus by PCR NEGATIVE NEGATIVE Final    Comment: (NOTE) Fact Sheet for Patients: BloggerCourse.com  Fact Sheet for Healthcare Providers: SeriousBroker.it  This test is not yet approved or cleared by the [place]  FDA and has been authorized for detection and/or diagnosis of SARS-CoV-2 by FDA under an Emergency Use Authorization (EUA). This EUA will remain in effect (meaning this test can be used) for the duration of the COVID-19 declaration under Section 564(b)(1) of the Act, 21 U.S.C. section 360bbb-3(b)(1), unless the authorization is terminated or revoked.  Performed at [facility], [address] [place], [place] [address]   MRSA Next Gen by PCR, Nasal     Status: None   Collection Time: 01/04/24  9:39 PM   Specimen: Nasal Mucosa; Nasal Swab  Result Value Ref Range Status   MRSA by PCR Next Gen NOT DETECTED NOT DETECTED Final    Comment: (NOTE) The GeneXpert MRSA Assay (FDA approved for NASAL specimens only), is one component of a comprehensive MRSA colonization surveillance program. It is not intended to diagnose MRSA infection nor to guide or monitor treatment for MRSA infections. Test performance is not FDA approved in patients less than 60 years old. Performed at [facility], [address] [place], [place] [address]   Culture, blood (Routine X 2) w Reflex to ID Panel     Status: None (Preliminary result)   Collection Time: 01/05/24  8:32 AM   Specimen: BLOOD  Result Value Ref Range Status   Specimen Description BLOOD LEFT ANTECUBITAL  Final   Special Requests   Final    BOTTLES DRAWN AEROBIC AND ANAEROBIC Blood Culture adequate volume   Culture   Final    NO GROWTH 2 DAYS Performed at [facility], [address] [place], [place] [address]    Report Status PENDING  Incomplete  Culture,  blood (Routine X 2) w Reflex to ID Panel     Status: None (Preliminary result)   Collection Time: 01/05/24  8:39 AM   Specimen: BLOOD  Result Value Ref Range Status   Specimen Description BLOOD RIGHT ANTECUBITAL  Final   Special Requests   Final    BOTTLES DRAWN AEROBIC AND ANAEROBIC Blood Culture adequate volume   Culture   Final    NO GROWTH 2 DAYS Performed at [facility], [address], [place], [place] [address]    Report Status PENDING  Incomplete      Radiology Studies last 3 days: DG Chest Port 1 View Result Date: 01/04/2024 CLINICAL DATA:  Shortness of breath.  History of colon cancer. EXAM: PORTABLE CHEST 1 VIEW COMPARISON:  None Available. FINDINGS: Right chest port tip in the right atrium. Volume loss in the right hemithorax with pleural effusion and opacity in the mid lower lung zone. The heart is grossly normal in size. Aortic atherosclerosis and tortuosity. Left lung is grossly clear. No pneumothorax.  The bones are subjectively under mineralized. IMPRESSION: Volume loss in the right hemithorax with pleural effusion and opacity in the mid lower lung zone. This may represent atelectasis, pneumonia, or neoplasm. Recommend correlation with any prior outside imaging. Electronically Signed   By: Andrea Gasman M.D.   On: 01/04/2024 17:26       Time spent: 50 min     Shane Ojo, DO [facility] 01/07/2024, 6:19 PM    Dictation software may have been used to generate the above note. Typos may occur and escape review in typed/dictated notes. Please contact Dr Marsa directly for clarity if needed.  Staff may message me via secure chat in Epic  but this may not receive an immediate response,  please page me for urgent matters!  If 7PM-7AM, please contact night coverage [email or web address]

## 2024-01-07 NOTE — Progress Notes (Signed)
 Shane Copping, MD [facility]   [address], [address] [place], [place] [address] Phone: [phone] Fax : [phone]   Subjective: This patient has a complicated medical history with a concern of GI bleeding.  It appears that the patient has had a colon mass causing obstruction that led to a cecal perforation.  The patient now presents with melena and anemia and was on dual acting anticoagulation for PE.  Patient's ostomy bag is showing green material without any sign of any black or bloody stools.   Objective: Vital signs in last 24 hours: Vitals:   01/07/24 0119 01/07/24 0550 01/07/24 0739 01/07/24 1159  BP: 114/79 109/75 113/75 123/83  Pulse: 100 85 88 94  Resp: 17 17  16   Temp: 97.6 F (36.4 C) 98.3 F (36.8 C) 98 F (36.7 C) 98.2 F (36.8 C)  TempSrc:      SpO2: 100% 99% 100% 100%  Weight:      Height:       Weight change:   Intake/Output Summary (Last 24 hours) at 01/07/2024 1417 Last data filed at 01/07/2024 0600 Gross per 24 hour  Intake 200 ml  Output 1650 ml  Net -1450 ml     Exam: General: Patient sitting up in bed in no apparent distress Abdomen: There is green stool in the ostomy bag.   Lab Results: @LABTEST2 @ Micro Results: Recent Results (from the past 240 hours)  Urine Culture (for pregnant, neutropenic or urologic patients or patients with an indwelling urinary catheter)     Status: Abnormal   Collection Time: 01/04/24  4:37 PM   Specimen: Urine, Random  Result Value Ref Range Status   Specimen Description   Final    URINE, RANDOM Performed at [facility], [address] [place], [place] [address]    Special Requests   Final    NONE Performed at [facility], [address] [place], [place] [address]    Culture >=100,000 COLONIES/mL ESCHERICHIA COLI (A)  Final   Report Status 01/07/2024 FINAL  Final   Organism ID, Bacteria ESCHERICHIA COLI (A)  Final      Susceptibility   Escherichia coli - MIC*    AMPICILLIN  >=32 RESISTANT Resistant     CEFAZOLIN (URINE) Value in next row Sensitive      8 SENSITIVEThis is a modified FDA-approved test that has been validated and its performance characteristics determined by the reporting laboratory.  This laboratory is certified under the Clinical Laboratory Improvement Amendments CLIA as qualified to perform high complexity clinical laboratory testing.    CEFEPIME Value in next row Sensitive      8 SENSITIVEThis is a modified FDA-approved test that has been validated and its performance characteristics determined by the reporting laboratory.  This laboratory is certified under the Clinical Laboratory Improvement Amendments CLIA as qualified to perform high complexity clinical laboratory testing.    ERTAPENEM Value in next row Sensitive      8 SENSITIVEThis is a modified FDA-approved test that has been validated and its performance characteristics determined by the reporting laboratory.  This laboratory is certified under the Clinical Laboratory Improvement Amendments CLIA as qualified to perform high complexity clinical laboratory testing.    CEFTRIAXONE Value in next row Sensitive      8 SENSITIVEThis is a modified FDA-approved test that has been validated and its performance characteristics determined by the reporting laboratory.  This laboratory is certified under the Clinical Laboratory Improvement Amendments CLIA as qualified to perform high  complexity clinical laboratory testing.    CIPROFLOXACIN Value in next row Sensitive      8 SENSITIVEThis is a modified FDA-approved test that has been validated and its performance characteristics determined by the reporting laboratory.  This laboratory is certified under the Clinical Laboratory Improvement Amendments CLIA as qualified to perform high complexity clinical laboratory testing.    GENTAMICIN Value in next row Sensitive      8 SENSITIVEThis is a modified FDA-approved test that has been validated and its performance  characteristics determined by the reporting laboratory.  This laboratory is certified under the Clinical Laboratory Improvement Amendments CLIA as qualified to perform high complexity clinical laboratory testing.    NITROFURANTOIN Value in next row Sensitive      8 SENSITIVEThis is a modified FDA-approved test that has been validated and its performance characteristics determined by the reporting laboratory.  This laboratory is certified under the Clinical Laboratory Improvement Amendments CLIA as qualified to perform high complexity clinical laboratory testing.    TRIMETH/SULFA Value in next row Resistant      8 SENSITIVEThis is a modified FDA-approved test that has been validated and its performance characteristics determined by the reporting laboratory.  This laboratory is certified under the Clinical Laboratory Improvement Amendments CLIA as qualified to perform high complexity clinical laboratory testing.    AMPICILLIN/SULBACTAM Value in next row Resistant      8 SENSITIVEThis is a modified FDA-approved test that has been validated and its performance characteristics determined by the reporting laboratory.  This laboratory is certified under the Clinical Laboratory Improvement Amendments CLIA as qualified to perform high complexity clinical laboratory testing.    PIP/TAZO Value in next row Sensitive      8 SENSITIVEThis is a modified FDA-approved test that has been validated and its performance characteristics determined by the reporting laboratory.  This laboratory is certified under the Clinical Laboratory Improvement Amendments CLIA as qualified to perform high complexity clinical laboratory testing.    MEROPENEM Value in next row Sensitive      8 SENSITIVEThis is a modified FDA-approved test that has been validated and its performance characteristics determined by the reporting laboratory.  This laboratory is certified under the Clinical Laboratory Improvement Amendments CLIA as qualified to  perform high complexity clinical laboratory testing.    * >=100,000 COLONIES/mL ESCHERICHIA COLI  Resp panel by RT-PCR (RSV, Flu A&B, Covid) Anterior Nasal Swab     Status: None   Collection Time: 01/04/24  4:49 PM   Specimen: Anterior Nasal Swab  Result Value Ref Range Status   SARS Coronavirus 2 by RT PCR NEGATIVE NEGATIVE Final    Comment: (NOTE) SARS-CoV-2 target nucleic acids are NOT DETECTED.  The SARS-CoV-2 RNA is generally detectable in upper respiratory specimens during the acute phase of infection. The lowest concentration of SARS-CoV-2 viral copies this assay can detect is 138 copies/mL. A negative result does not preclude SARS-Cov-2 infection and should not be used as the sole basis for treatment or other patient management decisions. A negative result may occur with  improper specimen collection/handling, submission of specimen other than nasopharyngeal swab, presence of viral mutation(s) within the areas targeted by this assay, and inadequate number of viral copies(<138 copies/mL). A negative result must be combined with clinical observations, patient history, and epidemiological information. The expected result is Negative.  Fact Sheet for Patients:  BloggerCourse.com  Fact Sheet for Healthcare Providers:  SeriousBroker.it  This test is no t yet approved or cleared by the [place]  FDA and  has been authorized for detection and/or diagnosis of SARS-CoV-2 by FDA under an Emergency Use Authorization (EUA). This EUA will remain  in effect (meaning this test can be used) for the duration of the COVID-19 declaration under Section 564(b)(1) of the Act, 21 U.S.C.section 360bbb-3(b)(1), unless the authorization is terminated  or revoked sooner.       Influenza A by PCR NEGATIVE NEGATIVE Final   Influenza B by PCR NEGATIVE NEGATIVE Final    Comment: (NOTE) The Xpert Xpress SARS-CoV-2/FLU/RSV plus assay is intended as  an aid in the diagnosis of influenza from Nasopharyngeal swab specimens and should not be used as a sole basis for treatment. Nasal washings and aspirates are unacceptable for Xpert Xpress SARS-CoV-2/FLU/RSV testing.  Fact Sheet for Patients: BloggerCourse.com  Fact Sheet for Healthcare Providers: SeriousBroker.it  This test is not yet approved or cleared by the [place]  FDA and has been authorized for detection and/or diagnosis of SARS-CoV-2 by FDA under an Emergency Use Authorization (EUA). This EUA will remain in effect (meaning this test can be used) for the duration of the COVID-19 declaration under Section 564(b)(1) of the Act, 21 U.S.C. section 360bbb-3(b)(1), unless the authorization is terminated or revoked.     Resp Syncytial Virus by PCR NEGATIVE NEGATIVE Final    Comment: (NOTE) Fact Sheet for Patients: BloggerCourse.com  Fact Sheet for Healthcare Providers: SeriousBroker.it  This test is not yet approved or cleared by the [place]  FDA and has been authorized for detection and/or diagnosis of SARS-CoV-2 by FDA under an Emergency Use Authorization (EUA). This EUA will remain in effect (meaning this test can be used) for the duration of the COVID-19 declaration under Section 564(b)(1) of the Act, 21 U.S.C. section 360bbb-3(b)(1), unless the authorization is terminated or revoked.  Performed at [facility], [address] [place], [place] [address]   MRSA Next Gen by PCR, Nasal     Status: None   Collection Time: 01/04/24  9:39 PM   Specimen: Nasal Mucosa; Nasal Swab  Result Value Ref Range Status   MRSA by PCR Next Gen NOT DETECTED NOT DETECTED Final    Comment: (NOTE) The GeneXpert MRSA Assay (FDA approved for NASAL specimens only), is one component of a comprehensive MRSA colonization surveillance program. It is not intended to diagnose  MRSA infection nor to guide or monitor treatment for MRSA infections. Test performance is not FDA approved in patients less than 53 years old. Performed at [facility], [address] [place], [place] [address]   Culture, blood (Routine X 2) w Reflex to ID Panel     Status: None (Preliminary result)   Collection Time: 01/05/24  8:32 AM   Specimen: BLOOD  Result Value Ref Range Status   Specimen Description BLOOD LEFT ANTECUBITAL  Final   Special Requests   Final    BOTTLES DRAWN AEROBIC AND ANAEROBIC Blood Culture adequate volume   Culture   Final    NO GROWTH 2 DAYS Performed at [facility], [address] [place], [place] [address]    Report Status PENDING  Incomplete  Culture, blood (Routine X 2) w Reflex to ID Panel     Status: None (Preliminary result)   Collection Time: 01/05/24  8:39 AM   Specimen: BLOOD  Result Value Ref Range Status   Specimen Description BLOOD RIGHT ANTECUBITAL  Final   Special Requests   Final    BOTTLES DRAWN AEROBIC AND ANAEROBIC Blood Culture adequate volume   Culture  Final    NO GROWTH 2 DAYS Performed at [facility], [address] [place], [place] [address]    Report Status PENDING  Incomplete   Studies/Results: No results found. Medications: I have reviewed the patient's current medications. Scheduled Meds:  (feeding supplement) PROSource Plus  30 mL Oral TID BM   ascorbic acid  500 mg Oral BID   cefadroxil  500 mg Oral BID   Chlorhexidine Gluconate Cloth  6 each Topical Daily   feeding supplement  1 Container Oral TID BM   ferrous sulfate  325 mg Oral Q12H   loperamide  2 mg Oral QID   melatonin  2.5 mg Oral QHS   multivitamin with minerals  1 tablet Oral Daily   nicotine  21 mg Transdermal Daily   pantoprazole (PROTONIX) IV  40 mg Intravenous Q12H   sodium chloride flush  10-40 mL Intracatheter Q12H   sodium chloride  1 g Oral BID WC   zinc sulfate (50mg  elemental zinc)  220 mg Oral Daily    Continuous Infusions: PRN Meds:.acetaminophen , albuterol, alum & mag hydroxide-simeth, dextromethorphan-guaiFENesin, ondansetron (ZOFRAN) IV, oxyCODONE, sodium chloride flush   Assessment: Principal Problem:   Symptomatic anemia Active Problems:   Iron deficiency anemia   Pulmonary embolism (HCC)   GERD (gastroesophageal reflux disease)   Colon cancer (HCC)   Tobacco use disorder   UTI (urinary tract infection)   DVT (deep venous thrombosis)_REU   COPD (chronic obstructive pulmonary disease) (HCC)   Pressure injury of skin   Protein calorie malnutrition   Thrombocytosis   Hyponatremia   GI bleeding   Protein-calorie malnutrition, severe    Plan: This patient has a history of a colon mass with a cecal perforation and a right hemicolectomy with end ileostomy with the original lesion appearing to be still present with a history of metastatic disease.  The patient has been on anticoagulation and the colon mass is unlikely to be amenable to any GI intervention at this time.  If the patient should have any significant bleeding please do not hesitate to contact GI again.  I will sign off.  Please call if any further GI concerns or questions.  We would like to thank you for the opportunity to participate in the care of QUENTIN SHOREY.    LOS: 3 days   Shane Copping, MD.FACG 01/07/2024, 2:17 PM Pager [phone] 7am-5pm  Check AMION for 5pm -7am coverage and on weekends

## 2024-01-08 ENCOUNTER — Inpatient Hospital Stay

## 2024-01-08 DIAGNOSIS — D649 Anemia, unspecified: Secondary | ICD-10-CM | POA: Diagnosis not present

## 2024-01-08 LAB — CBC
HCT: 25.1 % — ABNORMAL LOW (ref 39.0–52.0)
Hemoglobin: 8 g/dL — ABNORMAL LOW (ref 13.0–17.0)
MCH: 27.4 pg (ref 26.0–34.0)
MCHC: 31.9 g/dL (ref 30.0–36.0)
MCV: 86 fL (ref 80.0–100.0)
Platelets: 1358 K/uL (ref 150–400)
RBC: 2.92 MIL/uL — ABNORMAL LOW (ref 4.22–5.81)
RDW: 19.5 % — ABNORMAL HIGH (ref 11.5–15.5)
WBC: 11.9 K/uL — ABNORMAL HIGH (ref 4.0–10.5)
nRBC: 0 % (ref 0.0–0.2)

## 2024-01-08 LAB — COMPREHENSIVE METABOLIC PANEL WITH GFR
ALT: 7 U/L (ref 0–44)
AST: 14 U/L — ABNORMAL LOW (ref 15–41)
Albumin: 1.9 g/dL — ABNORMAL LOW (ref 3.5–5.0)
Alkaline Phosphatase: 76 U/L (ref 38–126)
Anion gap: 6 (ref 5–15)
BUN: 8 mg/dL (ref 8–23)
CO2: 25 mmol/L (ref 22–32)
Calcium: 8.5 mg/dL — ABNORMAL LOW (ref 8.9–10.3)
Chloride: 102 mmol/L (ref 98–111)
Creatinine, Ser: 0.52 mg/dL — ABNORMAL LOW (ref 0.61–1.24)
GFR, Estimated: >60 mL/min (ref >60)
Glucose, Bld: 100 mg/dL — ABNORMAL HIGH (ref 70–99)
Potassium: 3.7 mmol/L (ref 3.5–5.1)
Sodium: 133 mmol/L — ABNORMAL LOW (ref 135–145)
Total Bilirubin: <0.2 mg/dL (ref 0.0–1.2)
Total Protein: 7 g/dL (ref 6.5–8.1)

## 2024-01-08 LAB — GLUCOSE, CAPILLARY: Glucose-Capillary: 119 mg/dL — ABNORMAL HIGH (ref 70–99)

## 2024-01-08 MED ORDER — ENSURE PLUS HIGH PROTEIN PO LIQD
237.0000 mL | Freq: Three times a day (TID) | ORAL | Status: DC
  Administered 2024-01-08 – 2024-01-09 (×2): 237 mL via ORAL

## 2024-01-08 MED ORDER — IOHEXOL 9 MG/ML PO SOLN
500.0000 mL | ORAL | Status: AC
  Administered 2024-01-08 (×2): 500 mL via ORAL

## 2024-01-08 MED ORDER — IOHEXOL 350 MG/ML SOLN
75.0000 mL | Freq: Once | INTRAVENOUS | Status: AC | PRN
  Administered 2024-01-08: 75 mL via INTRAVENOUS

## 2024-01-08 NOTE — Progress Notes (Signed)
 Nutrition Follow-up  DOCUMENTATION CODES:   Underweight, Severe malnutrition in context of chronic illness  INTERVENTION:   -Continue regular diet -D/c Boost Breeze po TID, each supplement provides 250 kcal and 9 grams of protein  -D/c 30 ml Prosource Plus TID, each supplement provides 100 kcals and 15 grams protein -Continue 500 mg vitamin C BID -Continue 220 gm zinc sulfate daily -Continue MVI with minerals daily -Ensure Plus High Protein po TID, each supplement provides 350 kcal and 20 grams of protein  -RD will follow for lab results to assess for potential micronutrient deficiencies that could impede wound healing: vitamin A (still pending)   NUTRITION DIAGNOSIS:   Severe Malnutrition related to chronic illness (metastatic colon cancer) as evidenced by severe fat depletion, severe muscle depletion, percent weight loss.  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  10/20- advanced to full liquid diet, advanced to regular diet  Reviewed I/O's: -2.4 L x 24 hours and -3.3 L since admission  UOP: 1 L x 24 hours  Ileostomy output: 1.4 L x 24 hours  He has been advanced to a regular diet. No meal completion data available to assess at this time. He has been drinking supplements.   No new weight since last visit.   Per TOC notes, he was from [facility]; plan to return at discharge.   Medications reviewed and include vitamin C, ferrous sulfate, imodium, protonix, sodium chloride, and zinc sulfate.   Labs reviewed: Na:  133, CBGS: 91-119 (inpatient orders for glycemic control are ). Vitamin A pending.   Diet Order:   Diet Order             Diet regular Room service appropriate? Yes; Fluid consistency: Thin  Diet effective now                   EDUCATION NEEDS:   Education needs have been addressed  Skin:  Skin Assessment: Skin Integrity Issues: Skin Integrity Issues:: Incisions, Stage III, Stage IV Stage III: rt ischium Stage IV: sacrum Incisions: partial thickness midline surgical wound (healing)  Last BM:  01/07/24 (type 6- via ileostomy)  Height:   Ht Readings from Last 1 Encounters:  01/04/24 6' 5.01 (1.956 m)    Weight:   Wt Readings from Last 1 Encounters:  01/04/24 67.3 kg    Ideal Body Weight:  94.5 kg  BMI:  Body mass index is 17.59 kg/m.  Estimated Nutritional Needs:   Kcal:  7649-7449  Protein:  135-150 grams  Fluid:  2.0-2.2 L    Margery ORN, RD, LDN, CDCES Registered Dietitian III Certified Diabetes Care and Education Specialist If unable to reach this RD, please use RD Inpatient group chat on secure chat between hours of 8am-4 pm daily

## 2024-01-08 NOTE — Progress Notes (Signed)
 Progress Note   Patient: Shane [name] FMW:[id number] DOB: 11/30/1950 DOA: 01/04/2024     4 DOS: the patient was seen and examined on 01/08/2024   Brief hospital course:   Shane [name] is a 73 y.o. 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 Presented to ED w/ weakness and lightheadedness    HPI: He had recent month long hospitalization at [facility] due to descending colon mass causing obstruction that led to cecal perforation requiring right hemicolectomy and ileostomy.    10/18: Hgb 5.9 in ED, also UA concern for UTI. Was admitted to hospitalist service, also concern for dark bloody stool in ostomy. See A/P. 10/19: GI consult - monitor for now. Hgb remains stable 10/20: no further bleeding, advancing diet. Ask TOC to confirm he can go back to facility 10/21: were hoping to get him back to [facility] today but they don't have bed (daughter mistakenly gave it up, miscommunication led to having to start auth all over for rehab, see TOC). At any rate, pt is stable.          Consultants:  Gastroenterology   Procedures/Surgeries: none           ASSESSMENT & PLAN:   Symptomatic anemia  Chronic iron deficiency anemia ABLA GI bleed in setting of metastatic cancer, Eliquis  S/p 2 units PRBC IV pantoprazole   Zofran IV for nausea Avoid NSAIDs and SQ heparin Maintain IV access (2 large bore IVs if possible). 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 Follow-up with oncology  CT chest abdomen and pelvic requested for cancerstaging   Possible UTI (urinary tract infection): Patient has positive UA, denies symptoms UTI.  Since patient has leukocytosis WBC 20.1, will antibiotics empirically. IV Rocephin   History of pulmonary  embolism (HCC) and DVT (deep venous thrombosis) Need to hold Eliquis in setting of bleed - daughter is agreeable to this and prefers to hold indefinitely   GERD (gastroesophageal reflux disease) On Protonix   COPD (chronic obstructive pulmonary disease)  Patient has mild SOB and mild cough with little white mucus production, no fever, clinically does not have pneumonia.  No oxygen desaturation. Bronchodilators  as needed Mucinex   Tobacco use disorder nicotine patch   Thrombocytosis Platelets 1405, likely reactive. Check LDH and peripheral smear   Pressure injury of skin stage IV sacral ulcer POA Wound care   Protein calorie malnutrition, severe  Ensure Consult dietician.   Hyponatremia:  Sodium 129, mental status normal.   Likely due to poor oral intake and dehydration, metastatic disease . Monitor BMP     Underweight based on BMI: Body mass index is 17.59 kg/m. Healthy nutrition and physical activity advised as adjunct to other disease management and risk reduction treatments       DVT prophylaxis: SCD w/ GIB and recent anticoagulation    TOC needs: Back to SNF when able Medical barriers to dispo: none     Subjective / Brief ROS:  Patient denies chest pain nausea vomiting Patient seen in the presence of the daughter CT chest abdomen and pelvis requested for cancer staging    Family Communication: Discussed with daughter at bedside    Physical Exam Constitutional:      General: He is not in acute distress.  Appearance: He is ill-appearing.  Cardiovascular:     Rate and Rhythm: Normal rate and regular rhythm.     Heart sounds: Murmur heard.  Pulmonary:     Breath sounds: Normal breath sounds.  Abdominal:     General: Bowel sounds are normal.     Palpations: Abdomen is soft.  Musculoskeletal:     Right lower leg: No edema.     Left lower leg: No edema.  Neurological:     Mental Status: He is alert and oriented to person, place, and time.   Psychiatric:        Mood and Affect: Mood normal.        Behavior: Behavior normal.   Data Reviewed:  Vitals:   01/07/24 2335 01/08/24 0541 01/08/24 0735 01/08/24 1144  BP: 111/76 118/76 105/71 106/71  Pulse: 97 99 96 100  Resp: 18 20 16 16   Temp: 98.5 F (36.9 C) (!) 97.5 F (36.4 C) 98.5 F (36.9 C) 98.4 F (36.9 C)  TempSrc:   Oral Oral  SpO2: 100% 100% 100% 100%  Weight:      Height:          Latest Ref Rng & Units 01/08/2024    3:30 AM 01/07/2024    4:58 AM 01/06/2024   11:48 AM  CBC  WBC 4.0 - 10.5 K/uL 11.9  14.4  15.6   Hemoglobin 13.0 - 17.0 g/dL 8.0  7.6  8.0   Hematocrit 39.0 - 52.0 % 25.1  23.8  25.1   Platelets 150 - 400 K/uL 1,358  1,340  1,374        Latest Ref Rng & Units 01/08/2024    3:30 AM 01/05/2024   10:58 PM 01/05/2024    2:43 PM  BMP  Glucose 70 - 99 mg/dL 899  894  898   BUN 8 - 23 mg/dL 8  12  13    Creatinine 0.61 - 1.24 mg/dL 9.47  9.30  9.43   Sodium 135 - 145 mmol/L 133  132  133   Potassium 3.5 - 5.1 mmol/L 3.7  3.8  3.8   Chloride 98 - 111 mmol/L 102  98  97   CO2 22 - 32 mmol/L 25  22  21    Calcium 8.9 - 10.3 mg/dL 8.5  8.4  8.6      Author: Drue ONEIDA Potter, MD 01/08/2024 3:22 PM  For on call review [email or web address].

## 2024-01-09 DIAGNOSIS — D649 Anemia, unspecified: Secondary | ICD-10-CM | POA: Diagnosis not present

## 2024-01-09 LAB — CBC WITH DIFFERENTIAL/PLATELET
Abs Immature Granulocytes: 0.08 K/uL — ABNORMAL HIGH (ref 0.00–0.07)
Basophils Absolute: 0.1 K/uL (ref 0.0–0.1)
Basophils Relative: 0 %
Eosinophils Absolute: 0.3 K/uL (ref 0.0–0.5)
Eosinophils Relative: 2 %
HCT: 22.2 % — ABNORMAL LOW (ref 39.0–52.0)
Hemoglobin: 7.1 g/dL — ABNORMAL LOW (ref 13.0–17.0)
Immature Granulocytes: 1 %
Lymphocytes Relative: 13 %
Lymphs Abs: 1.8 K/uL (ref 0.7–4.0)
MCH: 27.2 pg (ref 26.0–34.0)
MCHC: 32 g/dL (ref 30.0–36.0)
MCV: 85.1 fL (ref 80.0–100.0)
Monocytes Absolute: 1 K/uL (ref 0.1–1.0)
Monocytes Relative: 7 %
Neutro Abs: 11.4 K/uL — ABNORMAL HIGH (ref 1.7–7.7)
Neutrophils Relative %: 77 %
Platelets: 1266 K/uL (ref 150–400)
RBC: 2.61 MIL/uL — ABNORMAL LOW (ref 4.22–5.81)
RDW: 19.4 % — ABNORMAL HIGH (ref 11.5–15.5)
WBC: 14.6 K/uL — ABNORMAL HIGH (ref 4.0–10.5)
nRBC: 0 % (ref 0.0–0.2)

## 2024-01-09 LAB — BASIC METABOLIC PANEL WITH GFR
Anion gap: 9 (ref 5–15)
BUN: 9 mg/dL (ref 8–23)
CO2: 24 mmol/L (ref 22–32)
Calcium: 8.4 mg/dL — ABNORMAL LOW (ref 8.9–10.3)
Chloride: 98 mmol/L (ref 98–111)
Creatinine, Ser: 0.53 mg/dL — ABNORMAL LOW (ref 0.61–1.24)
GFR, Estimated: >60 mL/min (ref >60)
Glucose, Bld: 101 mg/dL — ABNORMAL HIGH (ref 70–99)
Potassium: 3.7 mmol/L (ref 3.5–5.1)
Sodium: 131 mmol/L — ABNORMAL LOW (ref 135–145)

## 2024-01-09 LAB — GLUCOSE, CAPILLARY
Glucose-Capillary: 101 mg/dL — ABNORMAL HIGH (ref 70–99)
Glucose-Capillary: 98 mg/dL (ref 70–99)
Glucose-Capillary: 118 mg/dL — ABNORMAL HIGH (ref 70–99)

## 2024-01-09 LAB — PREPARE RBC (CROSSMATCH)

## 2024-01-09 MED ORDER — PANTOPRAZOLE SODIUM 40 MG PO TBEC
40.0000 mg | DELAYED_RELEASE_TABLET | Freq: Two times a day (BID) | ORAL | Status: DC
  Administered 2024-01-09 – 2024-01-11 (×4): 40 mg via ORAL
  Filled 2024-01-09 (×4): qty 1

## 2024-01-09 MED ORDER — SODIUM CHLORIDE 0.9% IV SOLUTION: Freq: Once | INTRAVENOUS | Status: AC

## 2024-01-09 NOTE — Evaluation (Signed)
 Physical Therapy Evaluation Patient Details Name: Shane [name] MRN: [id number] DOB: February 01, 1951 Today's Date: 01/09/2024  History of Present Illness  Shane [name] is a 73 y.o. 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 Presented to ED w/ weakness and lightheadedness.  Clinical Impression  Pt is 73 y.o. male admitted for symptomatic anemia. Prior to hospitalization, pt at [facility] and reports working on ambulation there. Unsure of PLOF with ADLs as pt avoid answering the question. Pt is A&Ox3.  Pt requires min A for supine>sit for trunk elevation. Verbal cues provided for technique/hand placement. Pt demonstrates STS with min A from elevated bed height. Pt able to amb ~54ft using RW without LOB or endorsing dizziness. Pt demonstrates understanding of pressure relief while sitting to promote skin integrity. Pt demonstrates deficits in strength/balance/activity tolerance. Would benefit from skilled PT to address above deficits and promote optimal return to PLOF.        If plan is discharge home, recommend the following: Assist for transportation;A lot of help with walking and/or transfers;A lot of help with bathing/dressing/bathroom   Can travel by private vehicle   Yes    Equipment Recommendations Other (comment) (TBD at next venue)  Recommendations for Other Services       Functional Status Assessment Patient has had a recent decline in their functional status and demonstrates the ability to make significant improvements in function in a reasonable and predictable amount of time.     Precautions / Restrictions Precautions Precautions: Fall Recall of Precautions/Restrictions: Impaired Restrictions Weight Bearing Restrictions Per Provider Order: No      Mobility  Bed Mobility Overal bed  mobility: Needs Assistance Bed Mobility: Supine to Sit     Supine to sit: Min assist     General bed mobility comments: Min A for trunk management. Able to move LEs to EOB with cuing to initiate. HOB elevated and use of bed rails.    Transfers Overall transfer level: Needs assistance Equipment used: Rolling walker (2 wheels) Transfers: Sit to/from Stand, Bed to chair/wheelchair/BSC Sit to Stand: Min assist   Step pivot transfers: Min assist       General transfer comment: Min A for STS from elevated bed height. Verbal cues for hand placement. Multi-modal cues for step-pivot transfer to chair to guide hips in proper position. Min A for eccentric control when sitting. Pt has tendency to sit without chair being directly behind him.    Ambulation/Gait Ambulation/Gait assistance: Min assist Gait Distance (Feet): 40 Feet Assistive device: Rolling walker (2 wheels) Gait Pattern/deviations: Knee flexed in stance - right, Knee flexed in stance - left, Step-through pattern, Trunk flexed, Narrow base of support       General Gait Details: Trunk flexed while amb. Pt also demosntrates amb on forefoot during initial contact of gait cycle.  Stairs            Wheelchair Mobility     Tilt Bed    Modified Rankin (Stroke Patients Only)       Balance Overall balance assessment: Needs assistance Sitting-balance support: No upper extremity supported, Feet supported Sitting balance-Leahy Scale: Good Sitting balance - Comments: Able to maintain static sitting at EOB. Demonstrates understanding with pressure relief while in recliner.   Standing balance support: Bilateral upper extremity supported, During functional activity, Reliant on assistive device for balance Standing balance-Leahy  Scale: Fair Standing balance comment: Heavy reliance on RW for balance                             Pertinent Vitals/Pain Pain Assessment Pain Assessment: No/denies pain    Home  Living Family/patient expects to be discharged to:: Skilled nursing facility                   Additional Comments: From  Healthcare SNF    Prior Function Prior Level of Function : Needs assist             Mobility Comments: Pt reports he has been working on [employer] for rehab ADLs Comments: Unsure of PLOF with ADLs as pt avoid answering questions     Extremity/Trunk Assessment   Upper Extremity Assessment Upper Extremity Assessment: Generalized weakness    Lower Extremity Assessment Lower Extremity Assessment: Generalized weakness    Cervical / Trunk Assessment Cervical / Trunk Assessment: Normal  Communication   Communication Communication: No apparent difficulties    Cognition Arousal: Alert Behavior During Therapy: WFL for tasks assessed/performed, Flat affect   PT - Cognitive impairments: No apparent impairments, No family/caregiver present to determine baseline                       PT - Cognition Comments: Pt is A&Ox3. Agreeable to PT session Following commands: Impaired Following commands impaired: Follows one step commands with increased time     Cueing Cueing Techniques: Verbal cues, Visual cues     General Comments      Exercises     Assessment/Plan    PT Assessment Patient needs continued PT services  PT Problem List Decreased strength;Decreased activity tolerance;Decreased balance;Decreased mobility;Decreased safety awareness;Decreased skin integrity       PT Treatment Interventions Gait training;Functional mobility training;Therapeutic activities;Therapeutic exercise;Patient/family education    PT Goals (Current goals can be found in the Care Plan section)  Acute Rehab PT Goals Patient Stated Goal: none stated PT Goal Formulation: With patient Time For Goal Achievement: 01/23/24 Potential to Achieve Goals: Fair    Frequency Min 2X/week     Co-evaluation               AM-PAC PT 6 Clicks Mobility   Outcome Measure Help needed turning from your back to your side while in a flat bed without using bedrails?: A Little Help needed moving from lying on your back to sitting on the side of a flat bed without using bedrails?: A Little Help needed moving to and from a bed to a chair (including a wheelchair)?: A Little Help needed standing up from a chair using your arms (e.g., wheelchair or bedside chair)?: A Little Help needed to walk in hospital room?: A Little Help needed climbing 3-5 steps with a railing? : A Lot 6 Click Score: 17    End of Session Equipment Utilized During Treatment: Gait belt Activity Tolerance: Patient tolerated treatment well Patient left: in chair;with call bell/phone within reach;with chair alarm set Nurse Communication: Mobility status PT Visit Diagnosis: Muscle weakness (generalized) (M62.81);Other abnormalities of gait and mobility (R26.89)    Time: 8955-8896 PT Time Calculation (min) (ACUTE ONLY): 19 min   Charges:                 Neils Siracusa, SPT   Quientin Jent 01/09/2024, 12:22 PM

## 2024-01-09 NOTE — Progress Notes (Addendum)
 Progress Note   Shane [name]: Shane Shane [name] DOB: 07-21-1950 DOA: 01/04/2024     5 DOS: Shane Shane [name] was seen and examined on 01/09/2024   Brief hospital course: Shane Shane [name] is a 73 y.o. 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 Presented to ED w/ weakness and lightheadedness    HPI: He had recent month long hospitalization at [name] due to descending colon mass causing obstruction that led to cecal perforation requiring right hemicolectomy and ileostomy.    10/18: Hgb 5.9 in ED, also UA concern for UTI. Was admitted to hospitalist service, also concern for dark bloody stool in ostomy. See A/P. 10/19: GI consult - monitor for now. Hgb remains stable 10/20: no further bleeding, advancing diet. Ask TOC to confirm he can go back to facility 10/21: were hoping to get him back to [name] today but they don't have bed (daughter mistakenly gave it up, miscommunication led to having to start auth all over for rehab, see TOC). At any rate, pt is stable.          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 Follow-up with oncology  CT scan of Shane chest and abdomen showed large loculated appearing right pleural effusion with complete atelectasis of Shane right lower lobe and a large pleural mass situated over Shane central right diaphragm  0.8 cm nodule of Shane left lung base as well as multiple  peritoneal hepatic metastasis/also large necrotic mass in Shane left lower quadrant. IR guided thoracentesis requested   Possible UTI (urinary tract infection): Shane [name] has positive UA, denies symptoms UTI.  Since Shane [name] has leukocytosis WBC 20.1, will antibiotics empirically. Continue cefadroxil   History of pulmonary embolism (HCC) and DVT (deep venous thrombosis) Need to hold Eliquis in setting of bleed - daughter is agreeable to this and prefers to hold indefinitely   GERD (gastroesophageal reflux disease) On Protonix   COPD (chronic obstructive pulmonary disease)  Shane [name] has mild SOB and mild cough with little white mucus production, no fever, clinically does not have pneumonia.  No oxygen desaturation. Bronchodilators  as needed Mucinex   Tobacco use disorder nicotine patch   Thrombocytosis Platelets 1405, likely reactive. JAK2 ordered   Pressure injury of skin stage IV sacral ulcer POA Wound care   Protein calorie malnutrition, severe  Ensure Consult dietician.   Hyponatremia:  Sodium 129, mental status normal.   Likely due to poor oral intake and dehydration, metastatic disease . Monitor BMP     Underweight based on BMI: Body mass index is 17.59 kg/m. Healthy nutrition and physical activity advised as adjunct to other disease management and risk reduction treatments       DVT prophylaxis: SCD w/ GIB and recent anticoagulation     TOC needs: Back to SNF when able Medical barriers to dispo: none     Subjective Shane [name] denies chest pain nausea vomiting Shane [name] seen in Shane presence of  Shane daughter CT scan of Shane chest and abdomen showed large loculated appearing right pleural effusion with compressive atelectasis of Shane right middle and provide is situated over Shane central right diaphragm 0.8 cm nodule of Shane left lung base as well as multiple peritoneal hepatic metastasis/also large necrotic mass in Shane left lower quadrant.   Family Communication:  Discussed with daughter at bedside     Physical Exam Constitutional:      General: He is not in acute distress.    Appearance: He is ill-appearing.  Cardiovascular:     Rate and Rhythm: Normal rate and regular rhythm.     Heart sounds: Murmur heard.  Pulmonary:     Breath sounds: Normal breath sounds.  Abdominal:     General: Bowel sounds are normal.     Palpations: Abdomen is soft.  Musculoskeletal:     Right lower leg: No edema.     Left lower leg: No edema.  Neurological:     Mental Status: He is alert and oriented to person, place, and time.  Psychiatric:        Mood and Affect: Mood normal.        Behavior: Behavior normal.   Data Reviewed:     Latest Ref Rng & Units 01/09/2024    5:00 AM 01/08/2024    3:30 AM 01/07/2024    4:58 AM  CBC  WBC 4.0 - 10.5 K/uL 14.6  11.9  14.4   Hemoglobin 13.0 - 17.0 g/dL 7.1  8.0  7.6   Hematocrit 39.0 - 52.0 % 22.2  25.1  23.8   Platelets 150 - 400 K/uL 1,266  1,358  1,340        Latest Ref Rng & Units 01/09/2024    5:00 AM 01/08/2024    3:30 AM 01/05/2024   10:58 PM  BMP  Glucose 70 - 99 mg/dL 898  899  894   BUN 8 - 23 mg/dL 9  8  12    Creatinine 0.61 - 1.24 mg/dL 9.46  9.47  9.30   Sodium 135 - 145 mmol/L 131  133  132   Potassium 3.5 - 5.1 mmol/L 3.7  3.7  3.8   Chloride 98 - 111 mmol/L 98  102  98   CO2 22 - 32 mmol/L 24  25  22    Calcium 8.9 - 10.3 mg/dL 8.4  8.5  8.4      Vitals:   01/09/24 0747 01/09/24 1252 01/09/24 1317 01/09/24 1450  BP: 119/78 112/71 111/84 122/85  Pulse: 97 100 100 94  Resp: 16 16 17 17   Temp: 98.4 F (36.9 C) 98.5 F (36.9 C) 98.8 F (37.1 C) 98.4 F (36.9 C)  TempSrc: Oral Oral Oral Tympanic  SpO2: 100% 100% 100% 100%  Weight:      Height:         Author: Drue Shane Potter, MD 01/09/2024 4:07 PM  For on call review [email or web address].

## 2024-01-09 NOTE — Progress Notes (Signed)
 Handoff received this morning that patient refused to turn through the night and that the patient refused the dressing change. To complete the daily dressing change during dayshift today if the patient agreeable.

## 2024-01-09 NOTE — Evaluation (Signed)
 Occupational Therapy Evaluation Patient Details Name: Shane [name] MRN: [id number] DOB: 1951/01/14 Today's Date: 01/09/2024   History of Present Illness   Shane [name] is a 73 y.o. 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 Presented to ED w/ weakness and lightheadedness.        Clinical Impressions Pt was seen for OT evaluation this date. PTA, he is currently at [facility] for rehab. Reports prior to that he was managing on his own. Pt presents with deficits in strength, balance, activity tolerance, affecting safe and optimal ADL completion. Pt currently requires CGA for supine to sit at EOB with use of bed features and Min/Mod A for BLE management to return to supine. He required CGA for STS from significantly elevated bed height and Min/CGA for ambulation ~20 ft within the room utilizing RW with cues for safety and flexed knees/posture noted. Anticipate Min/Mod A for LB ADL management. Pt would benefit from skilled OT services to address noted impairments and functional limitations to maximize safety and independence while minimizing future risk of falls, injury, and readmission. Do anticipate the need for follow up OT services upon acute hospital DC.      If plan is discharge home, recommend the following:   A little help with walking and/or transfers;A lot of help with bathing/dressing/bathroom     Functional Status Assessment   Patient has had a recent decline in their functional status and demonstrates the ability to make significant improvements in function in a reasonable and predictable amount of time.     Equipment Recommendations   Other (comment) (defer to next venue)     Recommendations for Other Services         Precautions/Restrictions   Precautions Precautions:  Fall Recall of Precautions/Restrictions: Impaired Restrictions Weight Bearing Restrictions Per Provider Order: No     Mobility Bed Mobility Overal bed mobility: Needs Assistance Bed Mobility: Supine to Sit, Sit to Supine     Supine to sit: Contact guard, HOB elevated, Used rails Sit to supine: Mod assist, Min assist   General bed mobility comments: able to reach EOB on his own with use of bed features, Min/MOD A for BLE management to return to supine    Transfers Overall transfer level: Needs assistance Equipment used: Rolling walker (2 wheels) Transfers: Sit to/from Stand Sit to Stand: Contact guard assist, From elevated surface           General transfer comment: bed height significantly elevated at pt request and able to stand with CGA, cues for hand placement; MiN/CGA for ~20 ft ambulation within his room using RW with noted knee flexion and flexed posture      Balance Overall balance assessment: Needs assistance Sitting-balance support: No upper extremity supported, Feet supported Sitting balance-Leahy Scale: Good     Standing balance support: Bilateral upper extremity supported, During functional activity, Reliant on assistive device for balance Standing balance-Leahy Scale: Fair Standing balance comment: BUE reliance on RW, CGA for static standing balance                           ADL either performed or assessed with clinical judgement   ADL Overall ADL's : Needs assistance/impaired  Lower Body Dressing: Minimal assistance;Sitting/lateral leans Lower Body Dressing Details (indicate cue type and reason): simulated while seated EOB however d/t pt height, anticipate min A for safety             Functional mobility during ADLs: Contact guard assist;Rolling walker (2 wheels);Minimal assistance       Vision         Perception         Praxis         Pertinent Vitals/Pain Pain Assessment Pain Assessment:  No/denies pain     Extremity/Trunk Assessment Upper Extremity Assessment Upper Extremity Assessment: Generalized weakness   Lower Extremity Assessment Lower Extremity Assessment: Generalized weakness       Communication Communication Communication: No apparent difficulties   Cognition Arousal: Alert Behavior During Therapy: WFL for tasks assessed/performed, Flat affect                                 Following commands: Impaired Following commands impaired: Follows one step commands with increased time     Cueing  General Comments   Cueing Techniques: Verbal cues;Visual cues      Exercises Other Exercises Other Exercises: Edu on role of OT in acute setting and return to SNF for continued rehab. Pt verbalized understanding.   Shoulder Instructions      Home Living Family/patient expects to be discharged to:: Skilled nursing facility                                 Additional Comments: From [facility]      Prior Functioning/Environment Prior Level of Function : Needs assist             Mobility Comments: Pt reports he has been working on amb for rehab ADLs Comments: pt reports you fend for yourself over there assume he was managing his ADLs as best he could on his own when staff not able to assist    OT Problem List: Decreased strength;Decreased activity tolerance;Impaired balance (sitting and/or standing)   OT Treatment/Interventions: Self-care/ADL training;Therapeutic exercise;Therapeutic activities;Energy conservation;Patient/family education;DME and/or AE instruction;Balance training      OT Goals(Current goals can be found in the care plan section)   Acute Rehab OT Goals Patient Stated Goal: go back to rehab OT Goal Formulation: With patient Time For Goal Achievement: 01/23/24 Potential to Achieve Goals: Good ADL Goals Pt Will Perform Lower Body Bathing: with supervision;sitting/lateral leans;sit to/from  stand Pt Will Perform Lower Body Dressing: with supervision;sitting/lateral leans;sit to/from stand Pt Will Transfer to Toilet: with contact guard assist;with supervision;ambulating   OT Frequency:  Min 2X/week    Co-evaluation              AM-PAC OT 6 Clicks Daily Activity     Outcome Measure Help from another person eating meals?: None Help from another person taking care of personal grooming?: None Help from another person toileting, which includes using toliet, bedpan, or urinal?: A Little Help from another person bathing (including washing, rinsing, drying)?: A Lot Help from another person to put on and taking off regular upper body clothing?: A Little Help from another person to put on and taking off regular lower body clothing?: A Lot 6 Click Score: 18   End of Session Equipment Utilized During Treatment: Rolling walker (2 wheels);Gait belt Nurse Communication: Mobility status  Activity Tolerance: Patient tolerated  treatment well Patient left: in bed;with bed alarm set;with call bell/phone within reach  OT Visit Diagnosis: Other abnormalities of gait and mobility (R26.89);Muscle weakness (generalized) (M62.81)                Time: 8398-8376 OT Time Calculation (min): 22 min Charges:  OT General Charges $OT Visit: 1 Visit OT Evaluation $OT Eval Moderate Complexity: 1 Mod Havyn Ramo, OTR/L 01/09/24, 5:22 PM  Shona Pardo E Carrie Schoonmaker 01/09/2024, 5:19 PM

## 2024-01-10 ENCOUNTER — Inpatient Hospital Stay

## 2024-01-10 DIAGNOSIS — D649 Anemia, unspecified: Secondary | ICD-10-CM | POA: Diagnosis not present

## 2024-01-10 LAB — BASIC METABOLIC PANEL WITH GFR
Anion gap: 13 (ref 5–15)
BUN: 10 mg/dL (ref 8–23)
CO2: 23 mmol/L (ref 22–32)
Calcium: 8.5 mg/dL — ABNORMAL LOW (ref 8.9–10.3)
Chloride: 98 mmol/L (ref 98–111)
Creatinine, Ser: 0.63 mg/dL (ref 0.61–1.24)
GFR, Estimated: >60 mL/min (ref >60)
Glucose, Bld: 108 mg/dL — ABNORMAL HIGH (ref 70–99)
Potassium: 4.1 mmol/L (ref 3.5–5.1)
Sodium: 134 mmol/L — ABNORMAL LOW (ref 135–145)

## 2024-01-10 LAB — CULTURE, BLOOD (ROUTINE X 2)
Culture: NO GROWTH
Culture: NO GROWTH
Special Requests: ADEQUATE
Special Requests: ADEQUATE

## 2024-01-10 LAB — CBC WITH DIFFERENTIAL/PLATELET
Abs Immature Granulocytes: 0.1 K/uL — ABNORMAL HIGH (ref 0.00–0.07)
Basophils Absolute: 0.1 K/uL (ref 0.0–0.1)
Basophils Relative: 0 %
Eosinophils Absolute: 0.3 K/uL (ref 0.0–0.5)
Eosinophils Relative: 2 %
HCT: 23.8 % — ABNORMAL LOW (ref 39.0–52.0)
Hemoglobin: 7.7 g/dL — ABNORMAL LOW (ref 13.0–17.0)
Immature Granulocytes: 1 %
Lymphocytes Relative: 14 %
Lymphs Abs: 2.1 K/uL (ref 0.7–4.0)
MCH: 27.5 pg (ref 26.0–34.0)
MCHC: 32.4 g/dL (ref 30.0–36.0)
MCV: 85 fL (ref 80.0–100.0)
Monocytes Absolute: 1.2 K/uL — ABNORMAL HIGH (ref 0.1–1.0)
Monocytes Relative: 8 %
Neutro Abs: 11.5 K/uL — ABNORMAL HIGH (ref 1.7–7.7)
Neutrophils Relative %: 75 %
Platelets: 1162 K/uL (ref 150–400)
RBC: 2.8 MIL/uL — ABNORMAL LOW (ref 4.22–5.81)
RDW: 18.7 % — ABNORMAL HIGH (ref 11.5–15.5)
WBC: 15.2 K/uL — ABNORMAL HIGH (ref 4.0–10.5)
nRBC: 0 % (ref 0.0–0.2)

## 2024-01-10 LAB — BODY FLUID CELL COUNT WITH DIFFERENTIAL
Eos, Fluid: 0 %
Lymphs, Fluid: 41 %
Monocyte-Macrophage-Serous Fluid: 44 %
Neutrophil Count, Fluid: 15 %
Total Nucleated Cell Count, Fluid: 707 uL

## 2024-01-10 LAB — GLUCOSE, CAPILLARY: Glucose-Capillary: 146 mg/dL — ABNORMAL HIGH (ref 70–99)

## 2024-01-10 LAB — TYPE AND SCREEN
ABO/RH(D): A POS
Antibody Screen: NEGATIVE
Unit division: 0

## 2024-01-10 LAB — GRAM STAIN: Gram Stain: NONE SEEN

## 2024-01-10 LAB — BPAM RBC
Blood Product Expiration Date: 202511182359
ISSUE DATE / TIME: 202510231243
Unit Type and Rh: 6200

## 2024-01-10 LAB — GLUCOSE, PLEURAL OR PERITONEAL FLUID: Glucose, Fluid: 104 mg/dL

## 2024-01-10 MED ORDER — LIDOCAINE HCL (PF) 1 % IJ SOLN
10.0000 mL | Freq: Once | INTRAMUSCULAR | Status: AC
  Administered 2024-01-10: 10 mL via INTRADERMAL

## 2024-01-10 NOTE — Plan of Care (Signed)

## 2024-01-10 NOTE — Plan of Care (Signed)
  Problem: Clinical Measurements: Goal: Will remain free from infection Outcome: Progressing Goal: Respiratory complications will improve Outcome: Progressing Goal: Cardiovascular complication will be avoided Outcome: Progressing   Problem: Activity: Goal: Risk for activity intolerance will decrease Outcome: Progressing   

## 2024-01-10 NOTE — Progress Notes (Signed)
 Nutrition Follow-up  DOCUMENTATION CODES:   Underweight, Severe malnutrition in context of chronic illness  INTERVENTION:   -Once diet is resumed:   -Continue regular diet -Continue 500 mg vitamin C BID -Continue 220 gm zinc sulfate daily -Continue MVI with minerals daily -D/c Ensure Plus High Protein po TID, each supplement provides 350 kcal and 20 grams of protein  -Magic cup TID with meals, each supplement provides 290 kcal and 9 grams of protein  -RD will follow for lab results to assess for potential micronutrient deficiencies that could impede wound healing: vitamin A (still pending)   NUTRITION DIAGNOSIS:   Severe Malnutrition related to chronic illness (metastatic colon cancer) as evidenced by severe fat depletion, severe muscle depletion, percent weight loss.  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  10/20- advanced to full liquid diet, advanced to regular diet   Reviewed I/O's: -887 ml x 24 hours and -4.3 L since admission  UOP: 775 ml x 24 hours  Ileostomy output: 900 ml x 24 hours  Per MD notes, CT scan of chest and abdomen requested for further cancer staging; revealed large loculated appearing right pleural effusion with complete atelectasis of the right lower lobe, a large pleural mass situated over the central right diaphragm, and 0.8 cm nodule of the left lung base as well as multiple peritoneal hepatic metastasis/also large necrotic mass in the left lower quadrant. Thoracentesis has been requested.   Patient  is NPO for thoracentesis today. He was previously on a regular diet. Noted meal completions 50-75%. He has been refusing Ensure supplements.   Per TOC notes, he was from [facility] PTA; plan to return at discharge.   Medications reviewed and include ferrous sulfate, imodium, melatonin, MVI, protonix, and sodium chloride.   Labs reviewed: Na: 134, CBGS: 98-146 (inpatient orders for glycemic control are none).    Diet Order:   Diet Order             Diet NPO time specified Except for: Sips with Meds  Diet effective midnight                   EDUCATION NEEDS:   Education needs have been addressed  Skin:  Skin Assessment: Skin Integrity Issues: Skin Integrity Issues:: Incisions, Stage III, Stage IV Stage III: rt ischium Stage IV: sacrum Incisions: partial thickness midline surgical wound (healing)  Last BM:  01/07/24 (type 6- via ileostomy)  Height:   Ht Readings from Last 1 Encounters:  01/04/24 6' 5.01 (1.956 m)    Weight:   Wt Readings from Last 1 Encounters:  01/04/24 67.3 kg    Ideal Body Weight:  94.5 kg  BMI:  Body mass index is 17.59 kg/m.  Estimated Nutritional Needs:   Kcal:  7649-7449  Protein:  135-150 grams  Fluid:  2.0-2.2 L    Margery ORN, RD, LDN, CDCES Registered Dietitian III Certified Diabetes Care and Education Specialist If unable to reach this RD, please use RD Inpatient group chat on secure chat between hours of 8am-4 pm daily

## 2024-01-10 NOTE — Progress Notes (Signed)
 PT Cancellation Note  Patient Details Name: Shane [name] MRN: [id number] DOB: Aug 16, 1950   Cancelled Treatment:     Pt declined PT session due to being up in chair earlier and currently fatigued. Will re-attempt next available date per POC.    Darice JAYSON Bohr 01/10/2024, 2:39 PM

## 2024-01-10 NOTE — TOC Progression Note (Signed)
 Transition of Care [facility]) - Progression Note    Patient Details  Name: Shane [name] MRN: [id number] Date of Birth: [birth year]  Transition of [facility]) CM/SW Contact  Alfonso Rummer, LCSW Phone Number: 01/10/2024, 3:33 PM  Clinical Narrative:     KEN DELENA Rummer spk with [facility] with [facility]. When pt is medically ready he will return to [facility]. Insurance shara is pending at this time. Weekend RNCM is aware of placement and insurance authorization.                     Expected Discharge Plan and Services                                               Social Drivers of Health (SDOH) Interventions SDOH Screenings   Food Insecurity: No Food Insecurity (11/04/2023)   Received from [facility]  Housing: Unknown (01/05/2024)  Transportation Needs: No Transportation Needs (01/05/2024)  Utilities: Not At Risk (01/05/2024)  Financial Resource Strain: Low Risk  (11/04/2023)   Received from [facility]  Physical Activity: Sufficiently Active (04/22/2018)  Social Connections: Unknown (01/05/2024)  Stress: No Stress Concern Present (04/22/2018)  Tobacco Use: High Risk (01/04/2024)    Readmission Risk Interventions     No data to display

## 2024-01-10 NOTE — Procedures (Signed)
 PROCEDURE SUMMARY:  Successful US  guided right-sided thoracentesis. Yielded 1.2L of amber fluid. Patient tolerated procedure well. No immediate complications. EBL = trace  Specimen sent for labs.  Post procedure chest X-ray reveals no pneumothorax  Shane [name] [name] Bal PA-C 01/10/2024 4:31 PM

## 2024-01-10 NOTE — Progress Notes (Addendum)
 Progress Note   Patient: Shane [name] FMW:[id number] DOB: July 07, 1950 DOA: 01/04/2024     6 DOS: the patient was seen and examined on 01/10/2024     Brief hospital course: EARNEST MCGILLIS is a 73 y.o. 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 Presented to ED w/ weakness and lightheadedness    HPI: He had recent month long hospitalization at [facility] due to descending colon mass causing obstruction that led to cecal perforation requiring right hemicolectomy and ileostomy.    10/18: Hgb 5.9 in ED, also UA concern for UTI. Was admitted to hospitalist service, also concern for dark bloody stool in ostomy. See A/P. 10/19: GI consult - monitor for now. Hgb remains stable 10/20: no further bleeding, advancing diet. Ask TOC to confirm he can go back to facility 10/21: were hoping to get him back to [facility] today but they don't have bed (daughter mistakenly gave it up, miscommunication led to having to start auth all over for rehab, see TOC). At any rate, pt is stable.          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 Follow-up with oncology  CT scan of the chest and abdomen showed large loculated appearing right pleural effusion with complete atelectasis of the right lower lobe and a large pleural mass situated over the central right diaphragm  0.8 cm nodule of the left lung base as well as multiple peritoneal hepatic metastasis/also large necrotic  mass in the left lower quadrant. Status post IR guided thoracenthesis on 01/10/2024 Follow-up on pleural fluid analysis    Possible UTI (urinary tract infection): Patient has positive UA, denies symptoms UTI.  Since patient has leukocytosis WBC 20.1, will antibiotics empirically. Continue cefadroxil   History of pulmonary embolism (HCC) and DVT (deep venous thrombosis) Need to hold Eliquis in setting of bleed - daughter is agreeable to this and prefers to hold indefinitely   GERD (gastroesophageal reflux disease) On Protonix   COPD (chronic obstructive pulmonary disease)  Patient has mild SOB and mild cough with little white mucus production, no fever, clinically does not have pneumonia.  No oxygen desaturation. Bronchodilators  as needed Mucinex   Tobacco use disorder nicotine patch   Thrombocytosis Platelets 1405, likely reactive. JAK2 ordered   Pressure injury of skin stage IV sacral ulcer POA Wound care   Protein calorie malnutrition, severe  Ensure Consult dietician.   Hyponatremia:  Sodium 129, mental status normal.   Likely due to poor oral intake and dehydration, metastatic disease . Monitor BMP     Underweight based on BMI: Body mass index is 17.59 kg/m. Healthy nutrition and physical activity advised as adjunct to other disease management and risk reduction treatments       DVT prophylaxis: SCD w/ GIB and recent anticoagulation     TOC needs: Back to SNF when able Medical barriers to dispo: none     Subjective Patient has no acute overnight complaint Underwent IR guided Thora centesis  today Sample pending  Family Communication: Discussed with daughter at bedside     Physical Exam Constitutional:      General: He is not in acute distress.    Appearance: He is ill-appearing.  Cardiovascular:     Rate and Rhythm: Normal rate and regular rhythm.     Heart sounds: Murmur heard.  Pulmonary:     Breath sounds: Normal breath sounds.  Abdominal:      General: Bowel sounds are normal.     Palpations: Abdomen is soft.  Musculoskeletal:     Right lower leg: No edema.     Left lower leg: No edema.  Neurological:     Mental Status: He is alert and oriented to person, place, and time.  Psychiatric:        Mood and Affect: Mood normal.        Behavior: Behavior normal.   Data Reviewed:     Latest Ref Rng & Units 01/10/2024    6:30 AM 01/09/2024    5:00 AM 01/08/2024    3:30 AM  CBC  WBC 4.0 - 10.5 K/uL 15.2  14.6  11.9   Hemoglobin 13.0 - 17.0 g/dL 7.7  7.1  8.0   Hematocrit 39.0 - 52.0 % 23.8  22.2  25.1   Platelets 150 - 400 K/uL 1,162  1,266  1,358        Latest Ref Rng & Units 01/10/2024    6:30 AM 01/09/2024    5:00 AM 01/08/2024    3:30 AM  BMP  Glucose 70 - 99 mg/dL 891  898  899   BUN 8 - 23 mg/dL 10  9  8    Creatinine 0.61 - 1.24 mg/dL 9.36  9.46  9.47   Sodium 135 - 145 mmol/L 134  131  133   Potassium 3.5 - 5.1 mmol/L 4.1  3.7  3.7   Chloride 98 - 111 mmol/L 98  98  102   CO2 22 - 32 mmol/L 23  24  25    Calcium 8.9 - 10.3 mg/dL 8.5  8.4  8.5     Vitals:   01/10/24 1157 01/10/24 1200 01/10/24 1206 01/10/24 1502  BP: 101/68 110/71 102/73 123/79  Pulse:    (!) 102  Resp:   16 16  Temp:    98.8 F (37.1 C)  TempSrc:    Oral  SpO2:   99% 99%  Weight:      Height:         Author: Drue ONEIDA Potter, MD 01/10/2024 5:07 PM  For on call review [email or web address].

## 2024-01-10 NOTE — Progress Notes (Signed)
 Mobility Specialist - Progress Note    01/10/24 1300  Mobility  Activity Turned to right side;Turned to left side  Level of Assistance +2 (takes two people)  Range of Motion/Exercises Passive  Activity Response Tolerated fair  Mobility visit 1 Mobility (assisted with reposition in bed)  Mobility Specialist Start Time (ACUTE ONLY) 1238  Mobility Specialist Stop Time (ACUTE ONLY) 1247  Mobility Specialist Time Calculation (min) (ACUTE ONLY) 9 min   Pt was supine in bed on RA. (+2) assist. Pt needs in reach and bed alarm on.  Clem Rodes Mobility Specialist 01/10/24, 1:05 PM

## 2024-01-11 DIAGNOSIS — D649 Anemia, unspecified: Secondary | ICD-10-CM | POA: Diagnosis not present

## 2024-01-11 LAB — CBC WITH DIFFERENTIAL/PLATELET
Abs Immature Granulocytes: 0.09 K/uL — ABNORMAL HIGH (ref 0.00–0.07)
Basophils Absolute: 0.1 K/uL (ref 0.0–0.1)
Basophils Relative: 0 %
Eosinophils Absolute: 0.2 K/uL (ref 0.0–0.5)
Eosinophils Relative: 1 %
HCT: 23.6 % — ABNORMAL LOW (ref 39.0–52.0)
Hemoglobin: 7.7 g/dL — ABNORMAL LOW (ref 13.0–17.0)
Immature Granulocytes: 1 %
Lymphocytes Relative: 11 %
Lymphs Abs: 1.8 K/uL (ref 0.7–4.0)
MCH: 27.8 pg (ref 26.0–34.0)
MCHC: 32.6 g/dL (ref 30.0–36.0)
MCV: 85.2 fL (ref 80.0–100.0)
Monocytes Absolute: 1.1 K/uL — ABNORMAL HIGH (ref 0.1–1.0)
Monocytes Relative: 7 %
Neutro Abs: 12.8 K/uL — ABNORMAL HIGH (ref 1.7–7.7)
Neutrophils Relative %: 80 %
Platelets: 1146 K/uL (ref 150–400)
RBC: 2.77 MIL/uL — ABNORMAL LOW (ref 4.22–5.81)
RDW: 19 % — ABNORMAL HIGH (ref 11.5–15.5)
WBC: 16 K/uL — ABNORMAL HIGH (ref 4.0–10.5)
nRBC: 0 % (ref 0.0–0.2)

## 2024-01-11 LAB — BASIC METABOLIC PANEL WITH GFR
Anion gap: 8 (ref 5–15)
BUN: 12 mg/dL (ref 8–23)
CO2: 24 mmol/L (ref 22–32)
Calcium: 8.5 mg/dL — ABNORMAL LOW (ref 8.9–10.3)
Chloride: 97 mmol/L — ABNORMAL LOW (ref 98–111)
Creatinine, Ser: 0.62 mg/dL (ref 0.61–1.24)
GFR, Estimated: >60 mL/min (ref >60)
Glucose, Bld: 115 mg/dL — ABNORMAL HIGH (ref 70–99)
Potassium: 4.2 mmol/L (ref 3.5–5.1)
Sodium: 129 mmol/L — ABNORMAL LOW (ref 135–145)

## 2024-01-11 LAB — GLUCOSE, CAPILLARY: Glucose-Capillary: 99 mg/dL (ref 70–99)

## 2024-01-11 MED ORDER — HEPARIN SOD (PORK) LOCK FLUSH 100 UNIT/ML IV SOLN
500.0000 [IU] | Freq: Once | INTRAVENOUS | Status: AC
  Administered 2024-01-11: 500 [IU] via INTRAVENOUS
  Filled 2024-01-11: qty 5

## 2024-01-11 NOTE — Discharge Summary (Signed)
 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 Possible UTI (urinary tract infection) History of pulmonary embolism (HCC) and DVT (deep venous thrombosis) GERD (gastroesophageal reflux disease) COPD (chronic obstructive pulmonary disease)  Tobacco use disorder Thrombocytosis Pressure injury of skin stage IV sacral ulcer POA Protein calorie malnutrition, severe  Hyponatremia: Underweight based on BMI  Hospital Course: Shane [name] is a 73 y.o. 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 Presented to ED w/ weakness and lightheadedness   He had recent month long hospitalization at [facility] due to descending colon mass causing obstruction that led to cecal perforation requiring right hemicolectomy and ileostomy.  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 Follow-up with oncology at [facility] CT scan of the chest and abdomen showed large loculated appearing right pleural effusion with complete atelectasis of the right  lower lobe and a large pleural mass situated over the central right diaphragm  0.8 cm nodule of the left lung base as well as multiple peritoneal hepatic metastasis/also large necrotic mass in the left lower quadrant. Status post IR guided thoracenthesis on 01/10/2024     Possible UTI (urinary tract infection):  Has completed a course of cefadroxil   History of pulmonary embolism (HCC) and DVT (deep venous thrombosis) Need to hold Eliquis in setting of bleed - daughter is agreeable to this and prefers to hold indefinitely   GERD (gastroesophageal reflux disease) On Protonix   COPD (chronic obstructive pulmonary disease)  Patient has mild SOB and mild cough with little white mucus production, no fever, clinically does not have pneumonia.  No oxygen desaturation. Bronchodilators  as needed Mucinex   Tobacco use disorder nicotine patch   Thrombocytosis Platelets 1405, likely reactive. JAK2 pending   Pressure injury of skin stage IV sacral ulcer POA Continue wound care Silicone foam dressings to the right ischium and midline wound, change every 3 days. ASSESS UNDER dressings each shift for any acute changes in the wounds.  Cleanse sacral wound with Vashe pack wound with Vahse moist (not soaked gauze), top with ABD pad, secure with tape. Change daily     Protein calorie malnutrition, severe  Continue Ensure    Hyponatremia:  Sodium 129, mental status normal.   Likely due to poor oral intake and dehydration, metastatic disease . Monitor BMP     Underweight based on BMI: Body mass index is 17.59 kg/m. Healthy nutrition and physical activity advised as adjunct to other disease management and risk reduction treatments   Disposition: Skilled nursing facility Diet recommendation:  Cardiac diet DISCHARGE MEDICATION: Allergies  as of 01/11/2024   No Known Allergies      Medication List     STOP taking these medications    apixaban 5 MG Tabs tablet Commonly known as:  ELIQUIS       TAKE these medications    acetaminophen  325 MG tablet Commonly known as: TYLENOL  Take 650 mg by mouth every 6 (six) hours as needed for mild pain (pain score 1-3).   albuterol 108 (90 Base) MCG/ACT inhaler Commonly known as: VENTOLIN HFA Inhale 2 puffs into the lungs every 4 (four) hours as needed for wheezing.   alum & mag hydroxide-simeth 200-200-20 MG/5ML suspension Commonly known as: MAALOX/MYLANTA Take 30 mLs by mouth every 4 (four) hours as needed for indigestion or heartburn.   ascorbic acid 500 MG tablet Commonly known as: VITAMIN C Take 500 mg by mouth daily.   ENERGY BOOSTER PO Take by mouth.   ferrous sulfate 325 (65 FE) MG tablet Take 325 mg by mouth every 12 (twelve) hours.   loperamide 2 MG capsule Commonly known as: IMODIUM Take 2 mg by mouth 4 (four) times daily.   melatonin 3 MG Tabs tablet Take 3 mg by mouth at bedtime.   pantoprazole 40 MG tablet Commonly known as: PROTONIX Take 40 mg by mouth daily.        Discharge Exam: Filed Weights   01/04/24 2100  Weight: 67.3 kg   General: He is not in acute distress. Cardiovascular:     Rate and Rhythm: Normal rate and regular rhythm.     Heart sounds: Murmur heard.  Pulmonary:     Breath sounds: Normal breath sounds.  Abdominal:     General: Bowel sounds are normal.     Palpations: Abdomen is soft.  Musculoskeletal:     Right lower leg: No edema.     Left lower leg: No edema.  Neurological:     Mental Status: He is alert and oriented to person, place, and time.  Psychiatric:        Mood and Affect: Mood normal.        Behavior: Behavior normal.   Condition at discharge: good  The results of significant diagnostics from this hospitalization (including imaging, microbiology, ancillary and laboratory) are listed below for reference.   Imaging Studies: US  THORACENTESIS ASP PLEURAL SPACE W/IMG GUIDE Result Date: 01/10/2024 INDICATION: 73 year old male with a history of  colon cancer metastatic to the liver and lungs and COPD who presented to the ED with weakness and lightheadedness. Imaging significant for large right pleural effusion. Request for diagnostic and therapeutic thoracentesis. EXAM: ULTRASOUND GUIDED DIAGNOSTIC AND THERAPEUTIC, RIGHT-SIDED THORACENTESIS MEDICATIONS: 1% lidocaine, 6 mL. COMPLICATIONS: None immediate. PROCEDURE: An ultrasound guided thoracentesis was thoroughly discussed with the patient and questions answered. The benefits, risks, alternatives and complications were also discussed. The patient understands and wishes to proceed with the procedure. Written consent was obtained. Ultrasound was performed to localize and mark an adequate pocket of fluid in the right chest. The area was then prepped and draped in the normal sterile fashion. 1% Lidocaine was used for local anesthesia. Under ultrasound guidance a 6 Fr Safe-T-Centesis catheter was introduced. Thoracentesis was performed. The catheter was removed and a dressing applied. FINDINGS: A total of approximately 1.2 L of amber fluid was removed. Samples were sent to the laboratory as requested by the clinical team. IMPRESSION: Successful ultrasound guided right-sided thoracentesis yielding 1.2 L of pleural fluid. Procedure performed by: Sherrilee Bal, PA-C under the supervision of Dr. VEAR Lent  Electronically Signed   By: Wilkie Lent M.D.   On: 01/10/2024 16:47   DG Chest Port 1 View Result Date: 01/10/2024 CLINICAL DATA:  142230 Pleural effusion [id number] [id number] S/P thoracentesis [id number] EXAM: PORTABLE CHEST - 1 VIEW COMPARISON:  01/04/2024, 01/08/2024 FINDINGS: Significant decrease in size of the loculated right pleural effusion, now small in volume. Patchy opacities in the right lung base, likely atelectasis. No pneumothorax. Mild cardiomegaly. Right chest port in place terminating in the right atrium. Tortuous aorta with aortic atherosclerosis. No acute fracture or destructive lesions.  Multilevel thoracic osteophytosis. IMPRESSION: Significant interval decrease in size of the loculated right pleural effusion, now small in volume. No pneumothorax. Electronically Signed   By: Rogelia Myers M.D.   On: 01/10/2024 12:43   CT CHEST ABDOMEN PELVIS W CONTRAST Result Date: 01/08/2024 CLINICAL DATA:  Metastatic disease evaluation, history of colon cancer * Tracking Code: BO * EXAM: CT CHEST, ABDOMEN, AND PELVIS WITH CONTRAST TECHNIQUE: Multidetector CT imaging of the chest, abdomen and pelvis was performed following the standard protocol during bolus administration of intravenous contrast. RADIATION DOSE REDUCTION: This exam was performed according to the departmental dose-optimization program which includes automated exposure control, adjustment of the mA and/or kV according to patient size and/or use of iterative reconstruction technique. CONTRAST:  75mL OMNIPAQUE IOHEXOL 350 MG/ML SOLN COMPARISON:  None Available. FINDINGS: CT CHEST FINDINGS Cardiovascular: Right chest port catheter. Normal heart size. Left and right coronary artery calcifications. No pericardial effusion. Mediastinum/Nodes: No enlarged mediastinal, hilar, or axillary lymph nodes. Thyroid gland, trachea, and esophagus demonstrate no significant findings. Lungs/Pleura: Large, loculated appearing right pleural effusion with complete atelectasis of the right lower lobe and a large pleural mass situated over the central diaphragm measuring 7.2 x 4.8 x 3.0 cm (series 2, image 51, series 5, image 50). 0.8 cm nodule in the dependent left lung base (series 4, image 131). Musculoskeletal: No chest wall abnormality. No acute osseous findings. CT ABDOMEN PELVIS FINDINGS Hepatobiliary: Multiple hepatic capsular implants as detailed below. No gallstones, gallbladder wall thickening, or biliary dilatation. Pancreas: Unremarkable. No pancreatic ductal dilatation or surrounding inflammatory changes. Spleen: Splenosis in the left upper quadrant  (series 2, image 58). Adrenals/Urinary Tract: Adrenal glands are unremarkable. Kidneys are normal, without renal calculi, solid lesion, or hydronephrosis. Bladder is unremarkable. Stomach/Bowel: Stomach is within normal limits. Bulky, necrotic mass which encases a large portion of the descending and proximal sigmoid colon measuring 15.1 x 14.1 x 8.2 cm (series 2, image 76, series 5, image 39). Right lower quadrant diverting loop ileostomy no bowel obstruction. Vascular/Lymphatic: Aortic atherosclerosis. Enlarged retroperitoneal lymph nodes measuring up 2 1.9 x 1.3 cm (series 2, image 66). Reproductive: No mass or other abnormality. Other: No abdominal wall hernia or abnormality. Multiple peritoneal and hepatic capsular metastatic implants, largest lesion about the peripheral liver dome measuring 8.4 x 3.9 cm (series 2, image 58). Trace ascites. Musculoskeletal: No acute osseous findings. IMPRESSION: 1. Large, loculated appearing right pleural effusion with complete atelectasis of the right lower lobe and a large pleural mass situated over the central right diaphragm. 2. There is a 0.8 cm nodule of the dependent left lung base, nonspecific although highly suspicious for a small pulmonary metastasis. 3. Multiple peritoneal and hepatic capsular metastatic implants, largest lesion about the peripheral liver dome measuring 8.4 x 3.9 cm. 4. Enlarged retroperitoneal lymph nodes measuring up to 1.9 x 1.3 cm. 5. Bulky, necrotic mass in the left lower quadrant which encases a large portion of the descending and proximal  sigmoid colon measuring 15.1 x 14.1 x 8.2 cm. 6. Right lower quadrant diverting loop ileostomy. No bowel obstruction. 7. Coronary artery disease. Aortic Atherosclerosis (ICD10-I70.0). Electronically Signed   By: Marolyn JONETTA Jaksch M.D.   On: 01/08/2024 17:10   DG Chest Port 1 View Result Date: 01/04/2024 CLINICAL DATA:  Shortness of breath.  History of colon cancer. EXAM: PORTABLE CHEST 1 VIEW COMPARISON:  None  Available. FINDINGS: Right chest port tip in the right atrium. Volume loss in the right hemithorax with pleural effusion and opacity in the mid lower lung zone. The heart is grossly normal in size. Aortic atherosclerosis and tortuosity. Left lung is grossly clear. No pneumothorax. The bones are subjectively under mineralized. IMPRESSION: Volume loss in the right hemithorax with pleural effusion and opacity in the mid lower lung zone. This may represent atelectasis, pneumonia, or neoplasm. Recommend correlation with any prior outside imaging. Electronically Signed   By: Andrea Gasman M.D.   On: 01/04/2024 17:26    Microbiology: Results for orders placed or performed during the hospital encounter of 01/04/24  Urine Culture (for pregnant, neutropenic or urologic patients or patients with an indwelling urinary catheter)     Status: Abnormal   Collection Time: 01/04/24  4:37 PM   Specimen: Urine, Random  Result Value Ref Range Status   Specimen Description   Final    URINE, RANDOM Performed at [facility], [address], [place], [place] [address]    Special Requests   Final    NONE Performed at [facility], [address] [place], [place] [address]    Culture >=100,000 COLONIES/mL ESCHERICHIA COLI (A)  Final   Report Status 01/07/2024 FINAL  Final   Organism ID, Bacteria ESCHERICHIA COLI (A)  Final      Susceptibility   Escherichia coli - MIC*    AMPICILLIN >=32 RESISTANT Resistant     CEFAZOLIN (URINE) Value in next row Sensitive      8 SENSITIVEThis is a modified FDA-approved test that has been validated and its performance characteristics determined by the reporting laboratory.  This laboratory is certified under the Clinical Laboratory Improvement Amendments CLIA as qualified to perform high complexity clinical laboratory testing.    CEFEPIME Value in next row Sensitive      8 SENSITIVEThis is a modified FDA-approved test that has been validated and its  performance characteristics determined by the reporting laboratory.  This laboratory is certified under the Clinical Laboratory Improvement Amendments CLIA as qualified to perform high complexity clinical laboratory testing.    ERTAPENEM Value in next row Sensitive      8 SENSITIVEThis is a modified FDA-approved test that has been validated and its performance characteristics determined by the reporting laboratory.  This laboratory is certified under the Clinical Laboratory Improvement Amendments CLIA as qualified to perform high complexity clinical laboratory testing.    CEFTRIAXONE Value in next row Sensitive      8 SENSITIVEThis is a modified FDA-approved test that has been validated and its performance characteristics determined by the reporting laboratory.  This laboratory is certified under the Clinical Laboratory Improvement Amendments CLIA as qualified to perform high complexity clinical laboratory testing.    CIPROFLOXACIN Value in next row Sensitive      8 SENSITIVEThis is a modified FDA-approved test that has been validated and its performance characteristics determined by the reporting laboratory.  This laboratory is certified under the Clinical Laboratory Improvement Amendments CLIA as qualified to perform high complexity clinical laboratory testing.  GENTAMICIN Value in next row Sensitive      8 SENSITIVEThis is a modified FDA-approved test that has been validated and its performance characteristics determined by the reporting laboratory.  This laboratory is certified under the Clinical Laboratory Improvement Amendments CLIA as qualified to perform high complexity clinical laboratory testing.    NITROFURANTOIN Value in next row Sensitive      8 SENSITIVEThis is a modified FDA-approved test that has been validated and its performance characteristics determined by the reporting laboratory.  This laboratory is certified under the Clinical Laboratory Improvement Amendments CLIA as qualified to  perform high complexity clinical laboratory testing.    TRIMETH/SULFA Value in next row Resistant      8 SENSITIVEThis is a modified FDA-approved test that has been validated and its performance characteristics determined by the reporting laboratory.  This laboratory is certified under the Clinical Laboratory Improvement Amendments CLIA as qualified to perform high complexity clinical laboratory testing.    AMPICILLIN/SULBACTAM Value in next row Resistant      8 SENSITIVEThis is a modified FDA-approved test that has been validated and its performance characteristics determined by the reporting laboratory.  This laboratory is certified under the Clinical Laboratory Improvement Amendments CLIA as qualified to perform high complexity clinical laboratory testing.    PIP/TAZO Value in next row Sensitive      8 SENSITIVEThis is a modified FDA-approved test that has been validated and its performance characteristics determined by the reporting laboratory.  This laboratory is certified under the Clinical Laboratory Improvement Amendments CLIA as qualified to perform high complexity clinical laboratory testing.    MEROPENEM Value in next row Sensitive      8 SENSITIVEThis is a modified FDA-approved test that has been validated and its performance characteristics determined by the reporting laboratory.  This laboratory is certified under the Clinical Laboratory Improvement Amendments CLIA as qualified to perform high complexity clinical laboratory testing.    * >=100,000 COLONIES/mL ESCHERICHIA COLI  Resp panel by RT-PCR (RSV, Flu A&B, Covid) Anterior Nasal Swab     Status: None   Collection Time: 01/04/24  4:49 PM   Specimen: Anterior Nasal Swab  Result Value Ref Range Status   SARS Coronavirus 2 by RT PCR NEGATIVE NEGATIVE Final    Comment: (NOTE) SARS-CoV-2 target nucleic acids are NOT DETECTED.  The SARS-CoV-2 RNA is generally detectable in upper respiratory specimens during the acute phase of  infection. The lowest concentration of SARS-CoV-2 viral copies this assay can detect is 138 copies/mL. A negative result does not preclude SARS-Cov-2 infection and should not be used as the sole basis for treatment or other patient management decisions. A negative result may occur with  improper specimen collection/handling, submission of specimen other than nasopharyngeal swab, presence of viral mutation(s) within the areas targeted by this assay, and inadequate number of viral copies(<138 copies/mL). A negative result must be combined with clinical observations, patient history, and epidemiological information. The expected result is Negative.  Fact Sheet for Patients:  bloggercourse.com  Fact Sheet for Healthcare Providers:  seriousbroker.it  This test is no t yet approved or cleared by the [place]  FDA and  has been authorized for detection and/or diagnosis of SARS-CoV-2 by FDA under an Emergency Use Authorization (EUA). This EUA will remain  in effect (meaning this test can be used) for the duration of the COVID-19 declaration under Section 564(b)(1) of the Act, 21 U.S.C.section 360bbb-3(b)(1), unless the authorization is terminated  or revoked sooner.  Influenza A by PCR NEGATIVE NEGATIVE Final   Influenza B by PCR NEGATIVE NEGATIVE Final    Comment: (NOTE) The Xpert Xpress SARS-CoV-2/FLU/RSV plus assay is intended as an aid in the diagnosis of influenza from Nasopharyngeal swab specimens and should not be used as a sole basis for treatment. Nasal washings and aspirates are unacceptable for Xpert Xpress SARS-CoV-2/FLU/RSV testing.  Fact Sheet for Patients: bloggercourse.com  Fact Sheet for Healthcare Providers: seriousbroker.it  This test is not yet approved or cleared by the [place]  FDA and has been authorized for detection and/or diagnosis of SARS-CoV-2  by FDA under an Emergency Use Authorization (EUA). This EUA will remain in effect (meaning this test can be used) for the duration of the COVID-19 declaration under Section 564(b)(1) of the Act, 21 U.S.C. section 360bbb-3(b)(1), unless the authorization is terminated or revoked.     Resp Syncytial Virus by PCR NEGATIVE NEGATIVE Final    Comment: (NOTE) Fact Sheet for Patients: bloggercourse.com  Fact Sheet for Healthcare Providers: seriousbroker.it  This test is not yet approved or cleared by the [place]  FDA and has been authorized for detection and/or diagnosis of SARS-CoV-2 by FDA under an Emergency Use Authorization (EUA). This EUA will remain in effect (meaning this test can be used) for the duration of the COVID-19 declaration under Section 564(b)(1) of the Act, 21 U.S.C. section 360bbb-3(b)(1), unless the authorization is terminated or revoked.  Performed at [facility], [address] [place], [place] [address]   MRSA Next Gen by PCR, Nasal     Status: None   Collection Time: 01/04/24  9:39 PM   Specimen: Nasal Mucosa; Nasal Swab  Result Value Ref Range Status   MRSA by PCR Next Gen NOT DETECTED NOT DETECTED Final    Comment: (NOTE) The GeneXpert MRSA Assay (FDA approved for NASAL specimens only), is one component of a comprehensive MRSA colonization surveillance program. It is not intended to diagnose MRSA infection nor to guide or monitor treatment for MRSA infections. Test performance is not FDA approved in patients less than 23 years old. Performed at [facility] [address] [place], [place] [address]   Culture, blood (Routine X 2) w Reflex to ID Panel     Status: None   Collection Time: 01/05/24  8:32 AM   Specimen: BLOOD  Result Value Ref Range Status   Specimen Description BLOOD LEFT ANTECUBITAL  Final   Special Requests   Final    BOTTLES DRAWN AEROBIC AND ANAEROBIC Blood  Culture adequate volume   Culture   Final    NO GROWTH 5 DAYS Performed at [facility], [address] [place], [place] [address]    Report Status 01/10/2024 FINAL  Final  Culture, blood (Routine X 2) w Reflex to ID Panel     Status: None   Collection Time: 01/05/24  8:39 AM   Specimen: BLOOD  Result Value Ref Range Status   Specimen Description BLOOD RIGHT ANTECUBITAL  Final   Special Requests   Final    BOTTLES DRAWN AEROBIC AND ANAEROBIC Blood Culture adequate volume   Culture   Final    NO GROWTH 5 DAYS Performed at [facility], [address], [place], [place] [address]    Report Status 01/10/2024 FINAL  Final  Gram stain     Status: None (Preliminary result)   Collection Time: 01/10/24 12:00 PM   Specimen: PATH Cytology Pleural fluid  Result Value Ref Range Status   Specimen Description  Final    PLEURAL Performed at [facility], [address] [place], [place] [address]    Special Requests   Final    PLEURAL Performed at [facility], [address] [place], [place] [address]    Gram Stain   Final    NO WBC SEEN NO ORGANISMS SEEN Performed at [facility], [address] [place], [place] [address]    Report Status PENDING  Incomplete    Labs: CBC: Recent Labs  Lab 01/04/24 1636 01/05/24 0530 01/07/24 0458 01/08/24 0330 01/09/24 0500 01/10/24 0630 01/11/24 1036  WBC 20.1*   < > 14.4* 11.9* 14.6* 15.2* 16.0*  NEUTROABS 17.1*  --   --   --  11.4* 11.5* 12.8*  HGB 5.9*   < > 7.6* 8.0* 7.1* 7.7* 7.7*  HCT 18.8*   < > 23.8* 25.1* 22.2* 23.8* 23.6*  MCV 82.1   < > 85.9 86.0 85.1 85.0 85.2  PLT 1,405*   < > 1,340* 1,358* 1,266* 1,162* 1,146*   < > = values in this interval not displayed.   Basic Metabolic Panel: Recent Labs  Lab 01/05/24 0530 01/05/24 0832 01/05/24 2258 01/08/24 0330 01/09/24 0500 01/10/24 0630 01/11/24 1036  NA 133*   < > 132* 133* 131* 134* 129*  K 3.8   < > 3.8 3.7 3.7 4.1 4.2   CL 98   < > 98 102 98 98 97*  CO2 20*   < > 22 25 24 23 24   GLUCOSE 117*   < > 105* 100* 101* 108* 115*  BUN 12   < > 12 8 9 10 12   CREATININE 0.67   < > 0.69 0.52* 0.53* 0.63 0.62  CALCIUM 8.5*   < > 8.4* 8.5* 8.4* 8.5* 8.5*  MG 1.6*  --   --   --   --   --   --   PHOS 4.1  --   --   --   --   --   --    < > = values in this interval not displayed.   Liver Function Tests: Recent Labs  Lab 01/04/24 1636 01/08/24 0330  AST 22 14*  ALT 9 7  ALKPHOS 85 76  BILITOT 0.8 <0.2  PROT 7.5 7.0  ALBUMIN 2.3* 1.9*   CBG: Recent Labs  Lab 01/09/24 0735 01/09/24 1212 01/09/24 1648 01/10/24 0833 01/11/24 0743  GLUCAP 101* 98 118* 146* 99    Discharge time spent:  35 minutes.  Signed: Drue ONEIDA Potter, MD [facility] 01/11/2024

## 2024-01-11 NOTE — Plan of Care (Signed)

## 2024-01-12 LAB — ACID FAST SMEAR (AFB, MYCOBACTERIA): Acid Fast Smear: NEGATIVE

## 2024-01-13 LAB — VITAMIN A: Vitamin A (Retinoic Acid): 11.2 ug/dL — ABNORMAL LOW (ref 22.0–69.5)

## 2024-01-15 LAB — JAK2 GENOTYPR

## 2024-01-18 ENCOUNTER — Other Ambulatory Visit: Payer: Self-pay

## 2024-01-18 ENCOUNTER — Emergency Department

## 2024-01-18 ENCOUNTER — Inpatient Hospital Stay
Admission: EM | Admit: 2024-01-18 | Discharge: 2024-01-21 | DRG: 871 | Disposition: A | Discharge status: Skilled Nursing Facility | Source: Skilled Nursing Facility | Attending: Internal Medicine | Admitting: Internal Medicine

## 2024-01-18 DIAGNOSIS — A419 Sepsis, unspecified organism: Secondary | ICD-10-CM | POA: Diagnosis not present

## 2024-01-18 DIAGNOSIS — Z7901 Long term (current) use of anticoagulants: Secondary | ICD-10-CM

## 2024-01-18 DIAGNOSIS — Z79899 Other long term (current) drug therapy: Secondary | ICD-10-CM

## 2024-01-18 DIAGNOSIS — E878 Other disorders of electrolyte and fluid balance, not elsewhere classified: Secondary | ICD-10-CM | POA: Diagnosis present

## 2024-01-18 DIAGNOSIS — J189 Pneumonia, unspecified organism: Principal | ICD-10-CM | POA: Diagnosis present

## 2024-01-18 DIAGNOSIS — D75839 Thrombocytosis, unspecified: Secondary | ICD-10-CM | POA: Diagnosis present

## 2024-01-18 DIAGNOSIS — Z8249 Family history of ischemic heart disease and other diseases of the circulatory system: Secondary | ICD-10-CM

## 2024-01-18 DIAGNOSIS — E785 Hyperlipidemia, unspecified: Secondary | ICD-10-CM | POA: Diagnosis present

## 2024-01-18 DIAGNOSIS — I10 Essential (primary) hypertension: Secondary | ICD-10-CM | POA: Diagnosis not present

## 2024-01-18 DIAGNOSIS — J440 Chronic obstructive pulmonary disease with acute lower respiratory infection: Secondary | ICD-10-CM | POA: Diagnosis present

## 2024-01-18 DIAGNOSIS — D649 Anemia, unspecified: Secondary | ICD-10-CM | POA: Diagnosis present

## 2024-01-18 DIAGNOSIS — D508 Other iron deficiency anemias: Secondary | ICD-10-CM | POA: Diagnosis present

## 2024-01-18 DIAGNOSIS — Z86711 Personal history of pulmonary embolism: Secondary | ICD-10-CM

## 2024-01-18 DIAGNOSIS — Y95 Nosocomial condition: Secondary | ICD-10-CM | POA: Diagnosis present

## 2024-01-18 DIAGNOSIS — F17210 Nicotine dependence, cigarettes, uncomplicated: Secondary | ICD-10-CM | POA: Diagnosis present

## 2024-01-18 DIAGNOSIS — Z85038 Personal history of other malignant neoplasm of large intestine: Secondary | ICD-10-CM

## 2024-01-18 DIAGNOSIS — Z833 Family history of diabetes mellitus: Secondary | ICD-10-CM

## 2024-01-18 DIAGNOSIS — Z9049 Acquired absence of other specified parts of digestive tract: Secondary | ICD-10-CM

## 2024-01-18 DIAGNOSIS — K219 Gastro-esophageal reflux disease without esophagitis: Secondary | ICD-10-CM | POA: Diagnosis present

## 2024-01-18 DIAGNOSIS — Z86718 Personal history of other venous thrombosis and embolism: Secondary | ICD-10-CM

## 2024-01-18 LAB — CBC
HCT: 22.2 % — ABNORMAL LOW (ref 39.0–52.0)
Hemoglobin: 6.8 g/dL — ABNORMAL LOW (ref 13.0–17.0)
MCH: 26.6 pg (ref 26.0–34.0)
MCHC: 30.6 g/dL (ref 30.0–36.0)
MCV: 86.7 fL (ref 80.0–100.0)
Platelets: 1330 K/uL (ref 150–400)
RBC: 2.56 MIL/uL — ABNORMAL LOW (ref 4.22–5.81)
RDW: 19.5 % — ABNORMAL HIGH (ref 11.5–15.5)
WBC: 24 K/uL — ABNORMAL HIGH (ref 4.0–10.5)
nRBC: 0 % (ref 0.0–0.2)

## 2024-01-18 LAB — COMPREHENSIVE METABOLIC PANEL WITH GFR
ALT: 11 U/L (ref 0–44)
AST: 31 U/L (ref 15–41)
Albumin: 2.7 g/dL — ABNORMAL LOW (ref 3.5–5.0)
Alkaline Phosphatase: 108 U/L (ref 38–126)
Anion gap: 14 (ref 5–15)
BUN: 16 mg/dL (ref 8–23)
CO2: 23 mmol/L (ref 22–32)
Calcium: 9.6 mg/dL (ref 8.9–10.3)
Chloride: 96 mmol/L — ABNORMAL LOW (ref 98–111)
Creatinine, Ser: 0.92 mg/dL (ref 0.61–1.24)
GFR, Estimated: >60 mL/min (ref >60)
Glucose, Bld: 111 mg/dL — ABNORMAL HIGH (ref 70–99)
Potassium: 4.2 mmol/L (ref 3.5–5.1)
Sodium: 133 mmol/L — ABNORMAL LOW (ref 135–145)
Total Bilirubin: 0.4 mg/dL (ref 0.0–1.2)
Total Protein: 9.5 g/dL — ABNORMAL HIGH (ref 6.5–8.1)

## 2024-01-18 LAB — CBC WITH DIFFERENTIAL/PLATELET
Abs Immature Granulocytes: 0.19 K/uL — ABNORMAL HIGH (ref 0.00–0.07)
Basophils Absolute: 0.1 K/uL (ref 0.0–0.1)
Basophils Relative: 0 %
Eosinophils Absolute: 0.1 K/uL (ref 0.0–0.5)
Eosinophils Relative: 1 %
HCT: 27.7 % — ABNORMAL LOW (ref 39.0–52.0)
Hemoglobin: 8.5 g/dL — ABNORMAL LOW (ref 13.0–17.0)
Immature Granulocytes: 1 %
Lymphocytes Relative: 7 %
Lymphs Abs: 2 K/uL (ref 0.7–4.0)
MCH: 26.3 pg (ref 26.0–34.0)
MCHC: 30.7 g/dL (ref 30.0–36.0)
MCV: 85.8 fL (ref 80.0–100.0)
Monocytes Absolute: 1 K/uL (ref 0.1–1.0)
Monocytes Relative: 4 %
Neutro Abs: 23.1 K/uL — ABNORMAL HIGH (ref 1.7–7.7)
Neutrophils Relative %: 87 %
Platelets: 1403 K/uL (ref 150–400)
RBC: 3.23 MIL/uL — ABNORMAL LOW (ref 4.22–5.81)
RDW: 19.1 % — ABNORMAL HIGH (ref 11.5–15.5)
WBC: 26.3 K/uL — ABNORMAL HIGH (ref 4.0–10.5)
nRBC: 0 % (ref 0.0–0.2)

## 2024-01-18 LAB — URINALYSIS, ROUTINE W REFLEX MICROSCOPIC
Bilirubin Urine: NEGATIVE
Glucose, UA: NEGATIVE mg/dL
Hgb urine dipstick: NEGATIVE
Ketones, ur: NEGATIVE mg/dL
Nitrite: NEGATIVE
Protein, ur: 30 mg/dL — AB
Specific Gravity, Urine: 1.024 (ref 1.005–1.030)
pH: 5 (ref 5.0–8.0)

## 2024-01-18 LAB — CREATININE, SERUM
Creatinine, Ser: 0.57 mg/dL — ABNORMAL LOW (ref 0.61–1.24)
GFR, Estimated: >60 mL/min (ref >60)

## 2024-01-18 LAB — PROTIME-INR
INR: 1.1 (ref 0.8–1.2)
Prothrombin Time: 14.8 s (ref 11.4–15.2)

## 2024-01-18 LAB — RESP PANEL BY RT-PCR (RSV, FLU A&B, COVID)  RVPGX2
Influenza A by PCR: NEGATIVE
Influenza B by PCR: NEGATIVE
Resp Syncytial Virus by PCR: NEGATIVE
SARS Coronavirus 2 by RT PCR: NEGATIVE

## 2024-01-18 LAB — LACTIC ACID, PLASMA
Lactic Acid, Venous: 2.5 mmol/L (ref 0.5–1.9)
Lactic Acid, Venous: 3 mmol/L (ref 0.5–1.9)

## 2024-01-18 MED ORDER — SODIUM CHLORIDE 0.9 % IV SOLN: Freq: Once | INTRAVENOUS | Status: AC

## 2024-01-18 MED ORDER — MORPHINE SULFATE (PF) 2 MG/ML IV SOLN: 2.0000 mg | INTRAVENOUS | Status: DC | PRN

## 2024-01-18 MED ORDER — LOPERAMIDE HCL 2 MG PO CAPS
2.0000 mg | ORAL_CAPSULE | Freq: Four times a day (QID) | ORAL | Status: DC
  Administered 2024-01-18 – 2024-01-21 (×12): 2 mg via ORAL
  Filled 2024-01-18 (×12): qty 1

## 2024-01-18 MED ORDER — ENOXAPARIN SODIUM 40 MG/0.4ML IJ SOSY
40.0000 mg | PREFILLED_SYRINGE | INTRAMUSCULAR | Status: DC
  Administered 2024-01-18 – 2024-01-20 (×3): 40 mg via SUBCUTANEOUS
  Filled 2024-01-18 (×3): qty 0.4

## 2024-01-18 MED ORDER — TRAZODONE HCL 50 MG PO TABS: 25.0000 mg | ORAL_TABLET | Freq: Every evening | ORAL | Status: DC | PRN

## 2024-01-18 MED ORDER — GUAIFENESIN ER 600 MG PO TB12
600.0000 mg | ORAL_TABLET | Freq: Two times a day (BID) | ORAL | Status: DC
  Administered 2024-01-18 – 2024-01-21 (×6): 600 mg via ORAL
  Filled 2024-01-18 (×6): qty 1

## 2024-01-18 MED ORDER — SODIUM CHLORIDE 0.9 % IV SOLN
2.0000 g | INTRAVENOUS | Status: DC
  Administered 2024-01-19 – 2024-01-20 (×2): 2 g via INTRAVENOUS
  Filled 2024-01-18 (×3): qty 20

## 2024-01-18 MED ORDER — LACTATED RINGERS IV SOLN
150.0000 mL/h | INTRAVENOUS | Status: DC
  Administered 2024-01-18: 150 mL/h via INTRAVENOUS

## 2024-01-18 MED ORDER — ACETAMINOPHEN 325 MG PO TABS
650.0000 mg | ORAL_TABLET | Freq: Once | ORAL | Status: AC
  Administered 2024-01-19: 650 mg via ORAL
  Filled 2024-01-18: qty 2

## 2024-01-18 MED ORDER — ACETAMINOPHEN 325 MG PO TABS
650.0000 mg | ORAL_TABLET | Freq: Four times a day (QID) | ORAL | Status: DC | PRN
  Filled 2024-01-18: qty 2

## 2024-01-18 MED ORDER — MAGNESIUM HYDROXIDE 400 MG/5ML PO SUSP: 30.0000 mL | Freq: Every day | ORAL | Status: DC | PRN

## 2024-01-18 MED ORDER — ONDANSETRON HCL 4 MG/2ML IJ SOLN: 4.0000 mg | Freq: Four times a day (QID) | INTRAMUSCULAR | Status: DC | PRN

## 2024-01-18 MED ORDER — IPRATROPIUM-ALBUTEROL 0.5-2.5 (3) MG/3ML IN SOLN
3.0000 mL | Freq: Four times a day (QID) | RESPIRATORY_TRACT | Status: DC
  Administered 2024-01-18 – 2024-01-19 (×4): 3 mL via RESPIRATORY_TRACT
  Filled 2024-01-18 (×4): qty 3

## 2024-01-18 MED ORDER — SODIUM CHLORIDE 0.9 % IV SOLN
2.0000 g | Freq: Once | INTRAVENOUS | Status: AC
  Administered 2024-01-18: 2 g via INTRAVENOUS
  Filled 2024-01-18: qty 12.5

## 2024-01-18 MED ORDER — ALUM & MAG HYDROXIDE-SIMETH 200-200-20 MG/5ML PO SUSP
30.0000 mL | ORAL | Status: DC | PRN
Start: 2024-01-18 — End: 2024-01-21

## 2024-01-18 MED ORDER — SIMETHICONE 80 MG PO CHEW
80.0000 mg | CHEWABLE_TABLET | Freq: Four times a day (QID) | ORAL | Status: DC | PRN
Start: 2024-01-18 — End: 2024-01-21

## 2024-01-18 MED ORDER — VITAMIN C 500 MG PO TABS
500.0000 mg | ORAL_TABLET | Freq: Every day | ORAL | Status: DC
  Administered 2024-01-18 – 2024-01-21 (×4): 500 mg via ORAL
  Filled 2024-01-18 (×4): qty 1

## 2024-01-18 MED ORDER — MELATONIN 5 MG PO TABS
5.0000 mg | ORAL_TABLET | Freq: Every day | ORAL | Status: DC
  Administered 2024-01-18 – 2024-01-20 (×3): 5 mg via ORAL
  Filled 2024-01-18 (×3): qty 1

## 2024-01-18 MED ORDER — FERROUS SULFATE 325 (65 FE) MG PO TABS
325.0000 mg | ORAL_TABLET | Freq: Two times a day (BID) | ORAL | Status: DC
  Administered 2024-01-18 – 2024-01-21 (×6): 325 mg via ORAL
  Filled 2024-01-18 (×6): qty 1

## 2024-01-18 MED ORDER — ONDANSETRON HCL 4 MG PO TABS: 4.0000 mg | ORAL_TABLET | Freq: Four times a day (QID) | ORAL | Status: DC | PRN

## 2024-01-18 MED ORDER — PANTOPRAZOLE SODIUM 40 MG PO TBEC
40.0000 mg | DELAYED_RELEASE_TABLET | Freq: Every day | ORAL | Status: DC
  Administered 2024-01-18 – 2024-01-21 (×4): 40 mg via ORAL
  Filled 2024-01-18 (×4): qty 1

## 2024-01-18 MED ORDER — VANCOMYCIN HCL IN DEXTROSE 1-5 GM/200ML-% IV SOLN
1000.0000 mg | Freq: Once | INTRAVENOUS | Status: AC
  Administered 2024-01-18: 1000 mg via INTRAVENOUS
  Filled 2024-01-18: qty 200

## 2024-01-18 MED ORDER — DIPHENHYDRAMINE HCL 25 MG PO CAPS
25.0000 mg | ORAL_CAPSULE | Freq: Once | ORAL | Status: AC
  Administered 2024-01-19: 25 mg via ORAL
  Filled 2024-01-18: qty 1

## 2024-01-18 MED ORDER — ACETAMINOPHEN 650 MG RE SUPP: 650.0000 mg | Freq: Four times a day (QID) | RECTAL | Status: DC | PRN

## 2024-01-18 MED ORDER — SODIUM CHLORIDE 0.9 % IV SOLN
500.0000 mg | INTRAVENOUS | Status: DC
  Administered 2024-01-18 – 2024-01-20 (×3): 500 mg via INTRAVENOUS
  Filled 2024-01-18 (×4): qty 5

## 2024-01-18 MED ORDER — LACTATED RINGERS IV BOLUS (SEPSIS)
1000.0000 mL | Freq: Once | INTRAVENOUS | Status: AC
  Administered 2024-01-18: 1000 mL via INTRAVENOUS

## 2024-01-18 MED ORDER — HYDROCOD POLI-CHLORPHE POLI ER 10-8 MG/5ML PO SUER: 5.0000 mL | Freq: Two times a day (BID) | ORAL | Status: DC | PRN

## 2024-01-18 MED ORDER — LACTATED RINGERS IV SOLN: INTRAVENOUS | Status: DC

## 2024-01-18 MED ORDER — SODIUM CHLORIDE 0.9 % IV SOLN: 2.0000 g | INTRAVENOUS | Status: DC

## 2024-01-18 NOTE — ED Notes (Signed)
 [place] 1403

## 2024-01-18 NOTE — Assessment & Plan Note (Signed)
-   The patient will be typed and crossmatched and transfused 1 unit of packed red blood cells overnight. - Will follow posttransfusion H&H

## 2024-01-18 NOTE — ED Notes (Signed)
 2.5 latic

## 2024-01-18 NOTE — ED Provider Notes (Signed)
 [facility] Provider Note    Event Date/Time   First MD Initiated Contact with Patient 01/18/24 1625     (approximate)   History   No chief complaint on file.   HPI  Shane [name] is a 73 y.o. male history of symptomatic anemia, iron deficiency, thromboembolism, DVT, COPD hyponatremia  Report that sent from [facility] due to concerns of a low hemoglobin.  Additionally, patient has notes now indicating metastatic colon cancer followed by [facility] oncology, chronic anemia and thrombocytosis   Patient reports has been feeling okay, had no syncope in particular but he was told that he needed to come to the hospital for a low blood count at his nursing facility  Physical Exam   Triage Vital Signs: ED Triage Vitals  Encounter Vitals Group     BP 01/18/24 1618 104/73     Girls Systolic BP Percentile --      Girls Diastolic BP Percentile --      Boys Systolic BP Percentile --      Boys Diastolic BP Percentile --      Pulse Rate 01/18/24 1618 (!) 102     Resp 01/18/24 1618 18     Temp 01/18/24 1618 (!) 97.5 F (36.4 C)     Temp Source 01/18/24 1618 Oral     SpO2 01/18/24 1618 100 %     Weight 01/18/24 1619 136 lb 14.4 oz (62.1 kg)     Height 01/18/24 1619 6' 5 (1.956 m)     Head Circumference --      Peak Flow --      Pain Score 01/18/24 1619 0     Pain Loc --      Pain Education --      Exclude from Growth Chart --     Most recent vital signs: Vitals:   01/18/24 1618  BP: 104/73  Pulse: (!) 102  Resp: 18  Temp: (!) 97.5 F (36.4 C)  SpO2: 100%     General: Awake, no distress.  CV:  Good peripheral perfusion.  Slight tachycardia Resp:  Normal effort.  Clear bilaterally except slightly diminished over the right mid to lower lung field.  No distress speaks in clear sentences Abd:  No distention.  Soft nontender.  Normal colored stool in ostomy no bleeding noted Other:  See media upload.  Decubitus wounds noted, no purulence or foul  odor.  Does not appear superinfected at this time   ED Results / Procedures / Treatments   Labs (all labs ordered are listed, but only abnormal results are displayed) Labs Reviewed  CBC WITH DIFFERENTIAL/PLATELET  COMPREHENSIVE METABOLIC PANEL WITH GFR   RADIOLOGY  Chest x-ray concerning for right lower lobe pneumonia, interpreted by me.  Also noted a small right pleural effusion  DG Chest 2 View Result Date: 01/18/2024 EXAM: 2 VIEW(S) XRAY OF THE CHEST 01/18/2024 05:58:35 PM COMPARISON: 01/10/2024 CLINICAL HISTORY: elevated WBC count FINDINGS: LUNGS AND PLEURA: Patchy airspace opacity in the right lower lobe. Volume loss on the right. Small right pleural effusion. No pulmonary edema. No pneumothorax. HEART AND MEDIASTINUM: Right port-a-cath tip in the right atrium, unchanged. No acute abnormality of the cardiac and mediastinal silhouettes. BONES AND SOFT TISSUES: No acute osseous abnormality. IMPRESSION: 1. Patchy right lower lobe airspace opacity with associated volume loss, suspicious for pneumonia or atelectasis. 2. Small right pleural effusion. Electronically signed by: Franky Crease MD 01/18/2024 06:10 PM EDT RP Workstation: [id number]  PROCEDURES:  Critical Care performed: Yes, see critical care procedure note(s)  CRITICAL CARE Performed by: Oneil Budge   Total critical care time: *** minutes  Critical care time was exclusive of separately billable procedures and treating other patients.  Critical care was necessary to treat or prevent imminent or life-threatening deterioration.  Critical care was time spent personally by me on the following activities: development of treatment plan with patient and/or surrogate as well as nursing, discussions with consultants, evaluation of patient's response to treatment, examination of patient, obtaining history from patient or surrogate, ordering and performing treatments and interventions, ordering and review of laboratory studies,  ordering and review of radiographic studies, pulse oximetry and re-evaluation of patient's condition.   Procedures   MEDICATIONS ORDERED IN ED: Medications - No data to display   IMPRESSION / MDM / ASSESSMENT AND PLAN / ED COURSE  I reviewed the triage vital signs and the nursing notes.                              Differential diagnosis includes, but is not limited to, possible anemia, worsening of chronic anemia, after discussing with nurse practitioner facility and his family also concern for possible infection including pneumonia which he is evidently started on treatment for.  Patient's presentation is most consistent with {EM COPA:27473}  *** {If the patient is on the monitor, remove the brackets and asterisks on the sentence below and remember to document it as a Procedure as well. Otherwise delete the sentence below:1} {**The patient is on the cardiac monitor to evaluate for evidence of arrhythmia and/or significant heart rate changes.**} {Remember to include, when applicable, any/all of the following data: independent review of imaging independent review of labs (comment specifically on pertinent positives and negatives) review of specific prior hospitalizations, PCP/specialist notes, etc. discuss meds given and prescribed document any discussion with consultants (including hospitalists) any clinical decision tools you used and why (PECARN, NEXUS, etc.) did you consider admitting the patient? document social determinants of health affecting patient's care (homelessness, inability to follow up in a timely fashion, etc) document any pre-existing conditions increasing risk on current visit (e.g. diabetes and HTN increasing danger of high-risk chest pain/ACS) describes what meds you gave (especially parenteral) and why any other interventions?:1} Clinical Course as of 01/18/24 2214  Sat Jan 18, 2024  1705 Reviewed historic data, patient's hemoglobin is quite appropriate at  this time, paperwork accompanying him reports he had a hemoglobin of 6.2 on  lab from his care facility but today his hemoglobin in our facility is appropriate.  His white count is relatively elevated, but he also has elevation of his platelet count as well.  He has no obvious features of infection.  His bedsore does not appear to be superinfected he denies any fever he is not have any chills cough abdominal pain abnormal stooling etc.  Will check urinalysis and chest x-ray to evaluate for other cause of elevated white count, though hematologic seems a potential cause [MQ]  1706 Hemoglobin(!): 8.5 Appropriate in the context of historical hemoglobins [MQ]  1720 Spokewith patient's Daughter, Shane [name], who reports her father told her he was feeling fine too but the facility wanted him seen due to the concerns of the blood count being low.  [MQ]  1721 Daughter reports he had a CXR yesterday, and they gave him an IM injection of rocephin'. Daughter advises an NP is likely managing the care at his facility,  but typically follows at [facility], supposed to start treatment but could not last week.  [MQ]  1738 Per Crystal at nursing facility, patient is now on antibiotics as of yesterday for acute bronchitis.  [MQ]  1739 Per Crystal, his WBC at the facility was also 31,000, and previous to that it was 14,000.  This information was not included in the concern, and I have now requested in the facility's to have Randine dapper, nurse practitioner, give me a call to give me additional information.  From what I understand it seems the patient is likely being treated for concerns of an acute pulmonary infection or pneumonia, and this may be far more complicated today on his presentation than the initial concern documented as sending for concerns of a low hemoglobin [MQ]  1856 The patient has significant neutrophilia, in this setting with findings of pneumonia on his chest x-ray suspect the patient likely has sepsis.  I  have initiated broad-spectrum antibiotic coverage, currently resides at a care facility.  Patient will require admission for treatment of sepsis.  Code sepsis initiated [MQ]  1942 Patient accepted admission by Dr. Lawence [MQ]    Clinical Course User Index [MQ] Dicky Anes, MD   ----------------------------------------- 5:09 PM on 01/18/2024 ----------------------------------------- Due to the significant leukocytosis noted today, I discussed the patient's case with her oncologist Dr. Melanee, based on the history and todays differential ***  FINAL CLINICAL IMPRESSION(S) / ED DIAGNOSES   Final diagnoses:  None     Rx / DC Orders   ED Discharge Orders     None        Note:  This document was prepared using Dragon voice recognition software and may include unintentional dictation errors.

## 2024-01-18 NOTE — Assessment & Plan Note (Addendum)
-   Sepsis is manifested by tachycardia, tachypnea and leukocytosis. - The patient will be admitted to a progressive unit observation bed. - Will continue antibiotic therapy with IV Rocephin and Zithromax. - Mucolytic therapy be provided as well as duo nebs q.i.d. and q.4 hours p.r.n. - Will hold off long-acting beta agonist. - We will follow blood cultures. - The patient will be hydrated with IV lactated ringer.

## 2024-01-18 NOTE — ED Notes (Signed)
 This tech answered pt call light. Pt stated, I need the doctor to call my daughter and let her know what's going on. No further requests at this time.

## 2024-01-18 NOTE — Progress Notes (Signed)
 Pt being followed by [facility] for Sepsis protocol.

## 2024-01-18 NOTE — ED Notes (Signed)
 This tech answered pt call light. Pt requesting something for gas. No further requests at this time.

## 2024-01-18 NOTE — ED Notes (Signed)
 Pt c/o stomach pain. RN made aware of pt concern.

## 2024-01-18 NOTE — ED Notes (Signed)
 Gave patient urinal for UA, patient states he is unable to provide a sample at this time.

## 2024-01-18 NOTE — Assessment & Plan Note (Signed)
Will continue Eliquis. 

## 2024-01-18 NOTE — Assessment & Plan Note (Signed)
 Continue statin therapy

## 2024-01-18 NOTE — H&P (Addendum)
 [place]   PATIENT NAME: Shane [name]    MR#:  [id number]  DATE OF BIRTH:  [birth year]  DATE OF ADMISSION:  01/18/2024  PRIMARY CARE PHYSICIAN: Patient, No Pcp Per   Patient is coming from: Home  REQUESTING/REFERRING PHYSICIAN: Dicky Anes, MD  CHIEF COMPLAINT:  Low hemoglobin Cough  HISTORY OF PRESENT ILLNESS:  Shane [name] is a 73 y.o. 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  The patient has been having cough productive of clear sputum as well as dyspnea without wheezing.  He denied any fever or chills.  No nausea or vomiting or abdominal pain.  No chest pain or palpitations.  No dysuria, oliguria or hematuria or flank pain.  He denies any headache or dizziness or blurred vision.  No presyncope or syncope.  ED Course: Upon presentation to the ER, temperature was 97.5 and heart rate was 102 with otherwise normal vital signs.  Labs revealed mild hyponatremia 133 better than on 10/25 and hypochloremia of 96 compared to 97 then with albumin 2.7 and total protein 9.5 and otherwise unremarkable CMP.  Lactic acid was 2.5.  CBC showed leukocytosis 26.3 with neutrophilia and significant thrombocytosis of 1403 compared to 1146 and hemoglobin 8.5 hematocrit 27.7 better than previous levels.  Repeat H&H do came back 6.8 and 22.2 with leukocytosis of 24.  PT was 14.8 and INR 1.1.  Respiratory panel came back negative.  UA showed 11-20 WBCs with rare bacteria and small leukocytes. EKG as reviewed by me : None Imaging: 2 view chest x-ray showed patchy right lower lobe airspace opacity with associated volume loss suspicious for pneumonia or  atelectasis and small right pleural effusion.  The patient was given 1 L bolus of IV lactated ringer and 2 g of IV cefepime.  He will be admitted to an observation progressive unit bed for further evaluation and management. PAST MEDICAL HISTORY:   Past Medical History:  Diagnosis Date   Colon cancer (HCC)    GERD (gastroesophageal reflux disease)    Iron deficiency anemia    Knee injury 03/19/2018   Leg injury 12/17/2017   Pulmonary embolism (HCC)    Tobacco use disorder     PAST SURGICAL HISTORY:   Past Surgical History:  Procedure Laterality Date   FRACTURE SURGERY Right    foot  Partial colectomy with ileostomy  SOCIAL HISTORY:   Social History   Tobacco Use   Smoking status: Every Day    Current packs/day: 0.50    Average packs/day: 0.5 packs/day for 42.0 years (21.0 ttl pk-yrs)    Types: Cigarettes   Smokeless tobacco: Never  Substance Use Topics   Alcohol use: Yes    Alcohol/week: 6.0 standard drinks of alcohol    Types: 6 Cans of beer per week    Comment: occassionally during week with social friends     FAMILY HISTORY:   Family History  Problem Relation Age of Onset   Diabetes Mother    Hypertension Mother    Hypertension Father     DRUG ALLERGIES:  No Known Allergies  REVIEW OF SYSTEMS:   ROS As per history of present illness. All pertinent  systems were reviewed above. Constitutional, HEENT, cardiovascular, respiratory, GI, GU, musculoskeletal, neuro, psychiatric, endocrine, integumentary and hematologic systems were reviewed and are otherwise negative/unremarkable except for positive findings mentioned above in the HPI.   MEDICATIONS AT HOME:   Prior to Admission medications   Medication Sig Start Date End Date Taking? Authorizing Provider  acetaminophen  (TYLENOL ) 325 MG tablet Take 650 mg by mouth every 6 (six) hours as needed for mild pain (pain score 1-3).    [provider]  albuterol (VENTOLIN HFA) 108 (90 Base) MCG/ACT inhaler  Inhale 2 puffs into the lungs every 4 (four) hours as needed for wheezing.    [provider]  alum & mag hydroxide-simeth (MAALOX/MYLANTA) 200-200-20 MG/5ML suspension Take 30 mLs by mouth every 4 (four) hours as needed for indigestion or heartburn.    [provider]  ascorbic acid (VITAMIN C) 500 MG tablet Take 500 mg by mouth daily.    [provider]  ferrous sulfate 325 (65 FE) MG tablet Take 325 mg by mouth every 12 (twelve) hours.    [provider]  loperamide (IMODIUM) 2 MG capsule Take 2 mg by mouth 4 (four) times daily.    [provider]  melatonin 3 MG TABS tablet Take 3 mg by mouth at bedtime.    [provider]  Multiple Vitamins-Minerals (ENERGY BOOSTER PO) Take by mouth. Patient not taking: Reported on 01/04/2024    [provider]  pantoprazole (PROTONIX) 40 MG tablet Take 40 mg by mouth daily.    [provider]      VITAL SIGNS:  Blood pressure 121/81, pulse 99, temperature 98 F (36.7 C), temperature source Oral, resp. rate (!) 30, height 6' 5 (1.956 m), weight 62.1 kg, SpO2 100%.  PHYSICAL EXAMINATION:  Physical Exam  GENERAL:  73 y.o.-year-old patient lying in the bed with no acute distress.  EYES: Pupils equal, round, reactive to light and accommodation. No scleral icterus. Extraocular muscles intact.  HEENT: Head atraumatic, normocephalic. Oropharynx and nasopharynx clear.  NECK:  Supple, no jugular venous distention. No thyroid enlargement, no tenderness.  LUNGS: Diminished right basal breath sounds with right basal crackles.  No use of accessory muscles of respiration.  CARDIOVASCULAR: Regular rate and rhythm, S1, S2 normal. No murmurs, rubs, or gallops.  ABDOMEN: Soft, nondistended, nontender. Bowel sounds present. No organomegaly or mass.  Right colostomy in place EXTREMITIES: No pedal edema, cyanosis, or clubbing.  NEUROLOGIC: Cranial nerves II through XII are intact. Muscle strength 5/5  in all extremities. Sensation intact. Gait not checked.  PSYCHIATRIC: The patient is alert and oriented x 3.  Normal affect and good eye contact. SKIN: No obvious rash, lesion, or ulcer.   LABORATORY PANEL:   CBC Recent Labs  Lab 01/18/24 1954  WBC 24.0*  HGB 6.8*  HCT 22.2*  PLT 1,330*   ------------------------------------------------------------------------------------------------------------------  Chemistries  Recent Labs  Lab 01/18/24 1622 01/18/24 1954  NA 133*  --   K 4.2  --   CL 96*  --   CO2 23  --   GLUCOSE 111*  --   BUN 16  --   CREATININE 0.92 0.57*  CALCIUM 9.6  --   AST 31  --   ALT 11  --   ALKPHOS 108  --   BILITOT 0.4  --    ------------------------------------------------------------------------------------------------------------------  Cardiac Enzymes No results for input(s): TROPONINI in the last 168 hours. ------------------------------------------------------------------------------------------------------------------  RADIOLOGY:  DG Chest 2 View Result Date: 01/18/2024 EXAM: 2 VIEW(S)  XRAY OF THE CHEST 01/18/2024 05:58:35 PM COMPARISON: 01/10/2024 CLINICAL HISTORY: elevated WBC count FINDINGS: LUNGS AND PLEURA: Patchy airspace opacity in the right lower lobe. Volume loss on the right. Small right pleural effusion. No pulmonary edema. No pneumothorax. HEART AND MEDIASTINUM: Right port-a-cath tip in the right atrium, unchanged. No acute abnormality of the cardiac and mediastinal silhouettes. BONES AND SOFT TISSUES: No acute osseous abnormality. IMPRESSION: 1. Patchy right lower lobe airspace opacity with associated volume loss, suspicious for pneumonia or atelectasis. 2. Small right pleural effusion. Electronically signed by: Franky Crease MD 01/18/2024 06:10 PM EDT RP Workstation: [id number]      IMPRESSION AND PLAN:  Assessment and Plan: * Sepsis due to pneumonia (HCC) - Sepsis is manifested by tachycardia, tachypnea and leukocytosis. -  The patient will be admitted to a progressive unit observation bed. - Will continue antibiotic therapy with IV Rocephin and Zithromax. - Mucolytic therapy be provided as well as duo nebs q.i.d. and q.4 hours p.r.n. - Will hold off long-acting beta agonist. - We will follow blood cultures. - The patient will be hydrated with IV lactated ringer.   Symptomatic anemia - The patient will be typed and crossmatched and transfused 1 unit of packed red blood cells overnight. - Will follow posttransfusion H&H  Essential hypertension - Will continue antihypertensive therapy.  Dyslipidemia - Continue statin therapy.  History of pulmonary embolism - Will continue Eliquis.  GERD without esophagitis - Will continue PPI therapy.   DVT prophylaxis: Eliquis. Advanced Care Planning:  Code Status: full code.  This was discussed with the patient and he clearly mentioned that he wants everything done to keep him alive. Family Communication:  The plan of care was discussed in details with the patient (and family). I answered all questions. The patient agreed to proceed with the above mentioned plan. Further management will depend upon hospital course. Disposition Plan: Back to previous home environment Consults called: none Home. All the records are reviewed and case discussed with ED provider.  Status is: Observation  I certify that at the time of admission, it is my clinical judgment that the patient will require hospital care extending less than 2 midnights.                            Dispo: The patient is from: Home              Anticipated d/c is to: Home              Patient currently is not medically stable to d/c.              Difficult to place patient: No  Madison DELENA Peaches M.D on 01/18/2024 at 10:49 PM  [facility]   From 7 PM-7 AM, contact night-coverage [email or web address]  CC: Primary care physician; Patient, No Pcp Per

## 2024-01-18 NOTE — ED Triage Notes (Signed)
 Pt in via [facility] from [facility] care for low HGB. HGB at 7 from facility. Per EMS HGB at that level is normal for Pt. Facility did not want to send pt but daughter requesting it. Labs came back on pt yesterday.

## 2024-01-18 NOTE — Assessment & Plan Note (Signed)
-   Will continue antihypertensive therapy.

## 2024-01-18 NOTE — Assessment & Plan Note (Signed)
-   Will continue PPI therapy.

## 2024-01-18 NOTE — Consult Note (Signed)
 CODE SEPSIS - PHARMACY COMMUNICATION  **Broad Spectrum Antibiotics should be administered within 1 hour of Sepsis diagnosis**  Time Code Sepsis Called/Page Received: 1856  Antibiotics Ordered: Cefepime 2G x1, Vancomycin  1 G x1  Time of 1st antibiotic administration: 1945  Additional action taken by pharmacy: none  If necessary, Name of Provider/Nurse Contacted: n/a    Annabella LOISE Banks ,PharmD Clinical Pharmacist  01/18/2024  7:17 PM

## 2024-01-18 NOTE — ED Notes (Signed)
 Fall risk bundle is currently in place.

## 2024-01-19 DIAGNOSIS — A419 Sepsis, unspecified organism: Secondary | ICD-10-CM | POA: Diagnosis present

## 2024-01-19 DIAGNOSIS — Y95 Nosocomial condition: Secondary | ICD-10-CM | POA: Diagnosis present

## 2024-01-19 DIAGNOSIS — Z833 Family history of diabetes mellitus: Secondary | ICD-10-CM | POA: Diagnosis not present

## 2024-01-19 DIAGNOSIS — J440 Chronic obstructive pulmonary disease with acute lower respiratory infection: Secondary | ICD-10-CM | POA: Diagnosis present

## 2024-01-19 DIAGNOSIS — Z79899 Other long term (current) drug therapy: Secondary | ICD-10-CM | POA: Diagnosis not present

## 2024-01-19 DIAGNOSIS — K219 Gastro-esophageal reflux disease without esophagitis: Secondary | ICD-10-CM | POA: Diagnosis present

## 2024-01-19 DIAGNOSIS — E785 Hyperlipidemia, unspecified: Secondary | ICD-10-CM | POA: Diagnosis present

## 2024-01-19 DIAGNOSIS — I10 Essential (primary) hypertension: Secondary | ICD-10-CM | POA: Diagnosis present

## 2024-01-19 DIAGNOSIS — Z86718 Personal history of other venous thrombosis and embolism: Secondary | ICD-10-CM | POA: Diagnosis not present

## 2024-01-19 DIAGNOSIS — D75839 Thrombocytosis, unspecified: Secondary | ICD-10-CM | POA: Diagnosis present

## 2024-01-19 DIAGNOSIS — D508 Other iron deficiency anemias: Secondary | ICD-10-CM | POA: Diagnosis present

## 2024-01-19 DIAGNOSIS — F17210 Nicotine dependence, cigarettes, uncomplicated: Secondary | ICD-10-CM | POA: Diagnosis present

## 2024-01-19 DIAGNOSIS — Z9049 Acquired absence of other specified parts of digestive tract: Secondary | ICD-10-CM | POA: Diagnosis not present

## 2024-01-19 DIAGNOSIS — Z86711 Personal history of pulmonary embolism: Secondary | ICD-10-CM | POA: Diagnosis not present

## 2024-01-19 DIAGNOSIS — Z7901 Long term (current) use of anticoagulants: Secondary | ICD-10-CM | POA: Diagnosis not present

## 2024-01-19 DIAGNOSIS — Z8249 Family history of ischemic heart disease and other diseases of the circulatory system: Secondary | ICD-10-CM | POA: Diagnosis not present

## 2024-01-19 DIAGNOSIS — E878 Other disorders of electrolyte and fluid balance, not elsewhere classified: Secondary | ICD-10-CM | POA: Diagnosis present

## 2024-01-19 DIAGNOSIS — J189 Pneumonia, unspecified organism: Secondary | ICD-10-CM | POA: Diagnosis present

## 2024-01-19 DIAGNOSIS — Z85038 Personal history of other malignant neoplasm of large intestine: Secondary | ICD-10-CM | POA: Diagnosis not present

## 2024-01-19 LAB — CBC
HCT: 21.9 % — ABNORMAL LOW (ref 39.0–52.0)
Hemoglobin: 7 g/dL — ABNORMAL LOW (ref 13.0–17.0)
MCH: 27.1 pg (ref 26.0–34.0)
MCHC: 32 g/dL (ref 30.0–36.0)
MCV: 84.9 fL (ref 80.0–100.0)
Platelets: 1072 K/uL (ref 150–400)
RBC: 2.58 MIL/uL — ABNORMAL LOW (ref 4.22–5.81)
RDW: 18.4 % — ABNORMAL HIGH (ref 11.5–15.5)
WBC: 19.2 K/uL — ABNORMAL HIGH (ref 4.0–10.5)
nRBC: 0 % (ref 0.0–0.2)

## 2024-01-19 LAB — BASIC METABOLIC PANEL WITH GFR
Anion gap: 11 (ref 5–15)
BUN: 13 mg/dL (ref 8–23)
CO2: 21 mmol/L — ABNORMAL LOW (ref 22–32)
Calcium: 8.6 mg/dL — ABNORMAL LOW (ref 8.9–10.3)
Chloride: 98 mmol/L (ref 98–111)
Creatinine, Ser: 0.75 mg/dL (ref 0.61–1.24)
GFR, Estimated: >60 mL/min (ref >60)
Glucose, Bld: 90 mg/dL (ref 70–99)
Potassium: 4.1 mmol/L (ref 3.5–5.1)
Sodium: 130 mmol/L — ABNORMAL LOW (ref 135–145)

## 2024-01-19 LAB — MRSA NEXT GEN BY PCR, NASAL: MRSA by PCR Next Gen: NOT DETECTED

## 2024-01-19 LAB — PREPARE RBC (CROSSMATCH)

## 2024-01-19 LAB — LACTIC ACID, PLASMA
Lactic Acid, Venous: 3 mmol/L (ref 0.5–1.9)
Lactic Acid, Venous: 3.4 mmol/L (ref 0.5–1.9)

## 2024-01-19 LAB — GLUCOSE, CAPILLARY: Glucose-Capillary: 102 mg/dL — ABNORMAL HIGH (ref 70–99)

## 2024-01-19 LAB — PROTIME-INR
INR: 1.2 (ref 0.8–1.2)
Prothrombin Time: 15.4 s — ABNORMAL HIGH (ref 11.4–15.2)

## 2024-01-19 LAB — CORTISOL-AM, BLOOD: Cortisol - AM: 16.4 ug/dL (ref 6.7–22.6)

## 2024-01-19 MED ORDER — IPRATROPIUM-ALBUTEROL 0.5-2.5 (3) MG/3ML IN SOLN
3.0000 mL | RESPIRATORY_TRACT | Status: DC | PRN
Start: 2024-01-19 — End: 2024-01-21

## 2024-01-19 MED ORDER — LACTATED RINGERS IV SOLN: INTRAVENOUS | Status: DC

## 2024-01-19 NOTE — ED Notes (Signed)
 Pt hit call bell at this time. Pt requesting pillow to be adjusted and lights dimmed. Pt resting comfortably at this time.

## 2024-01-19 NOTE — Progress Notes (Signed)
 PROGRESS NOTE    Shane [name]  FMW:[id number] DOB: 09-08-1950 DOA: 01/18/2024 PCP: Patient, No Pcp Per    Brief Narrative:  73 y.o. 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  The patient has been having cough productive of clear sputum as well as dyspnea without wheezing.  He denied any fever or chills.  No nausea or vomiting or abdominal pain.  No chest pain or palpitations.  No dysuria, oliguria or hematuria or flank pain.  He denies any headache or dizziness or blurred vision.  No presyncope or syncope.   ED Course: Upon presentation to the ER, temperature was 97.5 and heart rate was 102 with otherwise normal vital signs.  Labs revealed mild hyponatremia 133 better than on 10/25 and hypochloremia of 96 compared to 97 then with albumin 2.7 and total protein 9.5 and otherwise unremarkable CMP.  Lactic acid was 2.5.  CBC showed leukocytosis 26.3 with neutrophilia and significant thrombocytosis of 1403 compared to 1146 and hemoglobin 8.5 hematocrit 27.7 better than previous levels.  Repeat H&H do came back 6.8 and 22.2 with leukocytosis of 24.  PT was 14.8 and INR 1.1.  Respiratory panel came back negative.  UA showed 11-20 WBCs with rare bacteria and small leukocytes.   Assessment & Plan:   Principal Problem:   Sepsis due to pneumonia [facility]) Active Problems:   Symptomatic anemia   Dyslipidemia   Essential hypertension   History of pulmonary embolism   GERD without esophagitis  * Sepsis due to pneumonia (HCC) Sepsis is manifested by tachycardia, tachypnea and leukocytosis.  Presumed source of infection is right side  pneumonia Plan: Continue Rocephin and azithromycin Check MRSA screen, if negative no indication for vancomycin  Mucolytic's Follow cultures   Symptomatic anemia Transfuse 1 unit PRBC.  Hemoglobin responded appropriately.  No indication for the transfusion at this time   Essential hypertension No BP meds on MAR   Dyslipidemia PTA statin   History of pulmonary embolism PTA Eliquis   GERD without esophagitis PTA PPI      DVT prophylaxis: Eliquis Code Status: Full Family Communication: None today Disposition Plan: Status is: Inpatient Remains inpatient appropriate because: Pneumonia on IV antibiotics   Level of care: Progressive  Consultants:  None  Procedures:  None  Antimicrobials: Rocephin Azithromycin   Subjective: Seen examined.  Resting in bed.  Patient fatigued otherwise no acute distress and no complaints  Objective: Vitals:   01/19/24 1100 01/19/24 1115 01/19/24 1130 01/19/24 1145  BP: 100/76  109/68   Pulse: 89 92 99 94  Resp: (!) 29 (!) 29 (!) 22 (!) 26  Temp:      TempSrc:      SpO2: 100% 100% 100% 100%  Weight:      Height:        Intake/Output Summary (Last 24 hours) at 01/19/2024 1200 Last data filed at 01/19/2024 1107 Gross per 24 hour  Intake 300 ml  Output 275 ml  Net 25 ml   Filed Weights   01/18/24 1619  Weight: 62.1 kg    Examination:  General exam: Appears calm and comfortable  Respiratory system: Right sided crackles.  Overall clear.  Normal work of  breathing.  Room air Cardiovascular system: S1-S2, RRR, no murmurs, no pedal edema Gastrointestinal system: Soft, NT/ND, normal bowel sounds Central nervous system: Alert and oriented. No focal neurological deficits. Extremities: Symmetric 5 x 5 power. Skin: No rashes, lesions or ulcers Psychiatry: Judgement and insight appear normal. Mood & affect appropriate.     Data Reviewed: I have personally reviewed following labs and imaging studies  CBC: Recent Labs  Lab  01/18/24 1622 01/18/24 1954 01/19/24 0521  WBC 26.3* 24.0* 19.2*  NEUTROABS 23.1*  --   --   HGB 8.5* 6.8* 7.0*  HCT 27.7* 22.2* 21.9*  MCV 85.8 86.7 84.9  PLT 1,403* 1,330* 1,072*   Basic Metabolic Panel: Recent Labs  Lab 01/18/24 1622 01/18/24 1954 01/19/24 0521  NA 133*  --  130*  K 4.2  --  4.1  CL 96*  --  98  CO2 23  --  21*  GLUCOSE 111*  --  90  BUN 16  --  13  CREATININE 0.92 0.57* 0.75  CALCIUM 9.6  --  8.6*   GFR: Estimated Creatinine Clearance: 70.1 mL/min (by C-G formula based on SCr of 0.75 mg/dL). Liver Function Tests: Recent Labs  Lab 01/18/24 1622  AST 31  ALT 11  ALKPHOS 108  BILITOT 0.4  PROT 9.5*  ALBUMIN 2.7*   No results for input(s): LIPASE, AMYLASE in the last 168 hours. No results for input(s): AMMONIA in the last 168 hours. Coagulation Profile: Recent Labs  Lab 01/18/24 1747 01/19/24 0521  INR 1.1 1.2   Cardiac Enzymes: No results for input(s): CKTOTAL, CKMB, CKMBINDEX, TROPONINI in the last 168 hours. BNP (last 3 results) No results for input(s): PROBNP in the last 8760 hours. HbA1C: No results for input(s): HGBA1C in the last 72 hours. CBG: No results for input(s): GLUCAP in the last 168 hours. Lipid Profile: No results for input(s): CHOL, HDL, LDLCALC, TRIG, CHOLHDL, LDLDIRECT in the last 72 hours. Thyroid Function Tests: No results for input(s): TSH, T4TOTAL, FREET4, T3FREE, THYROIDAB in the last 72 hours. Anemia Panel: No results for input(s): VITAMINB12, FOLATE, FERRITIN, TIBC, IRON, RETICCTPCT in the last 72 hours. Sepsis Labs: Recent Labs  Lab 01/18/24 1747 01/18/24 1954 01/19/24 0014 01/19/24 0836  LATICACIDVEN 2.5* 3.0* 3.4* 3.0*    Recent Results (from the past 240 hours)  Gram stain     Status: None (Preliminary result)   Collection Time: 01/10/24 12:00 PM   Specimen: PATH Cytology Pleural fluid  Result Value Ref Range Status   Specimen Description    Final    PLEURAL Performed at [facility], [address] [place], [place] [address]    Special Requests   Final    PLEURAL Performed at [facility], [address] [place], [place] [address]    Gram Stain   Final    NO WBC SEEN NO ORGANISMS SEEN Performed at [facility], [address] [place], [place] [address]    Report Status PENDING  Incomplete  Acid Fast Smear (AFB)     Status: None   Collection Time: 01/10/24 12:00 PM   Specimen: PATH Cytology Pleural fluid  Result Value Ref Range Status   AFB Specimen Processing Concentration  Final   Acid Fast Smear Negative  Final    Comment: (NOTE) Performed At: [facility] [address] [place], [place] [id number] Jennette Shorter MD Ey:[id number]    Source (AFB) PLEURAL  Final    Comment: Performed at [facility], [id number]  [address] [place], [place] [address]  Resp panel by RT-PCR (RSV, Flu A&B, Covid) Anterior Nasal Swab     Status: None   Collection Time: 01/18/24  5:47 PM   Specimen: Anterior Nasal Swab  Result Value Ref Range Status   SARS Coronavirus 2 by RT PCR NEGATIVE NEGATIVE Final    Comment: (NOTE) SARS-CoV-2 target nucleic acids are NOT DETECTED.  The SARS-CoV-2 RNA is generally detectable in upper respiratory specimens during the acute phase of infection. The lowest concentration of SARS-CoV-2 viral copies this assay can detect is 138 copies/mL. A negative result does not preclude SARS-Cov-2 infection and should not be used as the sole basis for treatment or other patient management decisions. A negative result may occur with  improper specimen collection/handling, submission of specimen other than nasopharyngeal swab, presence of viral mutation(s) within the areas targeted by this assay, and inadequate number of viral copies(<138 copies/mL). A negative result must be combined with clinical observations, patient history, and epidemiological information. The  expected result is Negative.  Fact Sheet for Patients:  bloggercourse.com  Fact Sheet for Healthcare Providers:  seriousbroker.it  This test is no t yet approved or cleared by the [place]  FDA and  has been authorized for detection and/or diagnosis of SARS-CoV-2 by FDA under an Emergency Use Authorization (EUA). This EUA will remain  in effect (meaning this test can be used) for the duration of the COVID-19 declaration under Section 564(b)(1) of the Act, 21 U.S.C.section 360bbb-3(b)(1), unless the authorization is terminated  or revoked sooner.       Influenza A by PCR NEGATIVE NEGATIVE Final   Influenza B by PCR NEGATIVE NEGATIVE Final    Comment: (NOTE) The Xpert Xpress SARS-CoV-2/FLU/RSV plus assay is intended as an aid in the diagnosis of influenza from Nasopharyngeal swab specimens and should not be used as a sole basis for treatment. Nasal washings and aspirates are unacceptable for Xpert Xpress SARS-CoV-2/FLU/RSV testing.  Fact Sheet for Patients: bloggercourse.com  Fact Sheet for Healthcare Providers: seriousbroker.it  This test is not yet approved or cleared by the [place]  FDA and has been authorized for detection and/or diagnosis of SARS-CoV-2 by FDA under an Emergency Use Authorization (EUA). This EUA will remain in effect (meaning this test can be used) for the duration of the COVID-19 declaration under Section 564(b)(1) of the Act, 21 U.S.C. section 360bbb-3(b)(1), unless the authorization is terminated or revoked.     Resp Syncytial Virus by PCR NEGATIVE NEGATIVE Final    Comment: (NOTE) Fact Sheet for Patients: bloggercourse.com  Fact Sheet for Healthcare Providers: seriousbroker.it  This test is not yet approved or cleared by the [place]  FDA and has been authorized for detection and/or  diagnosis of SARS-CoV-2 by FDA under an Emergency Use Authorization (EUA). This EUA will remain in effect (meaning this test can be used) for the duration of the COVID-19 declaration under Section 564(b)(1) of the Act, 21 U.S.C. section 360bbb-3(b)(1), unless the authorization is terminated or revoked.  Performed at [facility], [address] [place], [place] [address]   Blood Culture (routine x 2)     Status: None (Preliminary result)   Collection Time: 01/18/24  5:47 PM   Specimen: BLOOD  Result Value Ref Range Status   Specimen Description BLOOD LEFT ANTECUBITAL  Final   Special Requests   Final    BOTTLES DRAWN AEROBIC AND ANAEROBIC Blood Culture adequate volume   Culture   Final    NO GROWTH < 12 HOURS  Performed at [facility], [address] [place], [place] [address]    Report Status PENDING  Incomplete  Blood Culture (routine x 2)     Status: None (Preliminary result)   Collection Time: 01/18/24  5:47 PM   Specimen: BLOOD  Result Value Ref Range Status   Specimen Description BLOOD BLOOD RIGHT ARM  Final   Special Requests   Final    BOTTLES DRAWN AEROBIC AND ANAEROBIC Blood Culture adequate volume   Culture  Setup Time ANAEROBIC BOTTLE ONLY  Final   Culture   Final    NO GROWTH < 12 HOURS Performed at [facility], [address], [place], [place] [address]    Report Status PENDING  Incomplete  MRSA Next Gen by PCR, Nasal     Status: None   Collection Time: 01/19/24  8:35 AM   Specimen: Nasal Mucosa; Nasal Swab  Result Value Ref Range Status   MRSA by PCR Next Gen NOT DETECTED NOT DETECTED Final    Comment: (NOTE) The GeneXpert MRSA Assay (FDA approved for NASAL specimens only), is one component of a comprehensive MRSA colonization surveillance program. It is not intended to diagnose MRSA infection nor to guide or monitor treatment for MRSA infections. Test performance is not FDA approved in patients less than 75  years old. Performed at [facility], [address] [place], [place] [address]          Radiology Studies: DG Chest 2 View Result Date: 01/18/2024 EXAM: 2 VIEW(S) XRAY OF THE CHEST 01/18/2024 05:58:35 PM COMPARISON: 01/10/2024 CLINICAL HISTORY: elevated WBC count FINDINGS: LUNGS AND PLEURA: Patchy airspace opacity in the right lower lobe. Volume loss on the right. Small right pleural effusion. No pulmonary edema. No pneumothorax. HEART AND MEDIASTINUM: Right port-a-cath tip in the right atrium, unchanged. No acute abnormality of the cardiac and mediastinal silhouettes. BONES AND SOFT TISSUES: No acute osseous abnormality. IMPRESSION: 1. Patchy right lower lobe airspace opacity with associated volume loss, suspicious for pneumonia or atelectasis. 2. Small right pleural effusion. Electronically signed by: Franky Crease MD 01/18/2024 06:10 PM EDT RP Workstation: [id number]        Scheduled Meds:  ascorbic acid  500 mg Oral Daily   enoxaparin (LOVENOX) injection  40 mg Subcutaneous Q24H   ferrous sulfate  325 mg Oral Q12H   guaiFENesin  600 mg Oral BID   ipratropium-albuterol  3 mL Nebulization QID   loperamide  2 mg Oral QID   melatonin  5 mg Oral QHS   pantoprazole  40 mg Oral Daily   Continuous Infusions:  azithromycin Stopped (01/18/24 2156)   cefTRIAXone (ROCEPHIN)  IV Stopped (01/19/24 0831)   lactated ringers 75 mL/hr at 01/19/24 0852     LOS: 0 days    Calvin KATHEE Robson, MD [facility]   If 7PM-7AM, please contact night-coverage  01/19/2024, 12:00 PM

## 2024-01-19 NOTE — ED Notes (Signed)
 Advised nurse that patient has ready bed

## 2024-01-19 NOTE — ED Notes (Signed)
 This RN emptied pt ostomy bag

## 2024-01-19 NOTE — Progress Notes (Signed)
Per Dr Sreenath, dc tele monitoring  

## 2024-01-19 NOTE — Consult Note (Addendum)
 Patient is familiar to [facility] team from previous admission;  WOC Nurse ostomy consult note; ileostomy placed 10/2023 at [facility]  Stoma type/location: RLQ, ileostomy  Stomal assessment/size: 1 round, prolapsed 3cm  Peristomal assessment: not assessed  Treatment options for stomal/peristomal skin: 2 barrier ring Output  Ostomy pouching: 2pc.2 3/4 pouching system skin barrier Gerlean #2, if stool is liquid may use high output pouch Gerlean [phone] and hook to foley bedside drainage bag; if formed stool use 2 3/4 pouch Gerlean #649 barrier ring Gerlean [phone]  Education provided: patient resides in SNF   WOC Nurse Consult Note: patient with chronic Stage 4 Pressure Injury sacrum and  Reason for Consult:sacral wound  Wound type: 1.  Stage 4 Pressure Injury sacrum red moist scattered tan fibrinous tissue  2.  Stage 3 Pressure Injury R ischium red moist  3.  Full thickness abdominal wound status post colectomy 10/2023 at [facility]  Pressure Injury POA: Yes Measurement: see nursing flowsheet  Wound bed: as above  Drainage (amount, consistency, odor) see nursing flowsheet; per MD note no purulent or foul smelling drainage  Periwound: pink  Dressing procedure/placement/frequency:  Cleanse sacral wound with Vashe, do not rinse and allow to air dry.  Apply Vashe moistened Kerlix to wound bed daily, cover with dry gauze, ABD pad and tape or silicone foam whichever is preferred.  Cleanse R ischial wound with Vashe, allow to air dry. Apply a piece of silver hydrofiber Soila [phone]) cut to fit wound bed daily and secure with silicone foam.   If any open areas to abdominal wound noted cleanse with Vashe and apply a piece of silver hydrofiber Soila [phone]) to area daily, secure with silicone foam or ABD pad and tape whichever is preferred.   Patient should be placed on a low air loss mattress for pressure redistribution and moisture management.   POC discussed with bedside nurse. WOC team will not follow. Re-consult  if further needs arise.   Thank you,    Powell Bar MSN, RN-BC, [employer]

## 2024-01-20 DIAGNOSIS — A419 Sepsis, unspecified organism: Secondary | ICD-10-CM | POA: Diagnosis not present

## 2024-01-20 DIAGNOSIS — J189 Pneumonia, unspecified organism: Secondary | ICD-10-CM | POA: Diagnosis not present

## 2024-01-20 LAB — BASIC METABOLIC PANEL WITH GFR
Anion gap: 13 (ref 5–15)
BUN: 10 mg/dL (ref 8–23)
CO2: 22 mmol/L (ref 22–32)
Calcium: 8.3 mg/dL — ABNORMAL LOW (ref 8.9–10.3)
Chloride: 93 mmol/L — ABNORMAL LOW (ref 98–111)
Creatinine, Ser: 0.74 mg/dL (ref 0.61–1.24)
GFR, Estimated: >60 mL/min (ref >60)
Glucose, Bld: 91 mg/dL (ref 70–99)
Potassium: 4.1 mmol/L (ref 3.5–5.1)
Sodium: 128 mmol/L — ABNORMAL LOW (ref 135–145)

## 2024-01-20 LAB — CBC WITH DIFFERENTIAL/PLATELET
Abs Immature Granulocytes: 0.09 K/uL — ABNORMAL HIGH (ref 0.00–0.07)
Basophils Absolute: 0.1 K/uL (ref 0.0–0.1)
Basophils Relative: 0 %
Eosinophils Absolute: 0 K/uL (ref 0.0–0.5)
Eosinophils Relative: 0 %
HCT: 22.5 % — ABNORMAL LOW (ref 39.0–52.0)
Hemoglobin: 7.2 g/dL — ABNORMAL LOW (ref 13.0–17.0)
Immature Granulocytes: 1 %
Lymphocytes Relative: 9 %
Lymphs Abs: 1.4 K/uL (ref 0.7–4.0)
MCH: 26.8 pg (ref 26.0–34.0)
MCHC: 32 g/dL (ref 30.0–36.0)
MCV: 83.6 fL (ref 80.0–100.0)
Monocytes Absolute: 0.9 K/uL (ref 0.1–1.0)
Monocytes Relative: 6 %
Neutro Abs: 13.1 K/uL — ABNORMAL HIGH (ref 1.7–7.7)
Neutrophils Relative %: 84 %
Platelets: 1267 K/uL (ref 150–400)
RBC: 2.69 MIL/uL — ABNORMAL LOW (ref 4.22–5.81)
RDW: 18.6 % — ABNORMAL HIGH (ref 11.5–15.5)
WBC: 15.6 K/uL — ABNORMAL HIGH (ref 4.0–10.5)
nRBC: 0 % (ref 0.0–0.2)

## 2024-01-20 LAB — TYPE AND SCREEN
ABO/RH(D): A POS
Antibody Screen: NEGATIVE
Unit division: 0

## 2024-01-20 LAB — BPAM RBC
Blood Product Expiration Date: 202511232359
ISSUE DATE / TIME: 202511020122
Unit Type and Rh: 6200

## 2024-01-20 NOTE — Evaluation (Signed)
 Physical Therapy Evaluation Patient Details Name: Shane [name] MRN: [id number] DOB: March 19, 1951 Today's Date: 01/20/2024  History of Present Illness  73 y.o. 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  Clinical Impression  Pt is 73 y.o. male admitted for sepsis d/t pneumonia. Prior to hospitalization, pt is from [facility]. Pt requires mod A to stand from lowest bed height, down to min A at elevated bed height. SPT provided edu and cuing for forward weight shift to promote successful stand. Pt able to amb 66ft using RW and min A to avoid LOB. Pt reports fatigue after bout of amb. Pt demonstrates deficits in strength/activity tolerance/balance. Would benefit from skilled PT to address above deficits and promote optimal return to PLOF.       If plan is discharge home, recommend the following: Assist for transportation;A lot of help with walking and/or transfers;A lot of help with bathing/dressing/bathroom   Can travel by private vehicle   Yes    Equipment Recommendations Other (comment) (TBD at next venue)  Recommendations for Other Services       Functional Status Assessment Patient has had a recent decline in their functional status and demonstrates the ability to make significant improvements in function in a reasonable and predictable amount of time.     Precautions / Restrictions Precautions Precautions: Fall Recall of Precautions/Restrictions: Impaired Restrictions Weight Bearing Restrictions Per Provider Order: No      Mobility  Bed Mobility Overal bed mobility: Needs Assistance Bed Mobility: Supine to Sit, Sit  to Supine     Supine to sit: Min assist Sit to supine: Mod assist   General bed mobility comments: Able to reach EOB with use of bed rails. Cuing for sequencing. Pt able to manage trunk.    Transfers Overall transfer level: Needs assistance Equipment used: Rolling walker (2 wheels) Transfers: Sit to/from Stand Sit to Stand: Mod assist, Min assist, From elevated surface           General transfer comment: mod A to stand from lowest bed height down to min A with bed elevated. Verbal cues for forward weight shift for successful stand.    Ambulation/Gait Ambulation/Gait assistance: Min assist Gait Distance (Feet): 30 Feet Assistive device: Rolling walker (2 wheels) Gait Pattern/deviations: Knee flexed in stance - right, Knee flexed in stance - left, Step-through pattern, Trunk flexed, Narrow base of support Gait velocity: dec     General Gait Details: Trunk flexed and L knee valgus while amb. Pt with dec B heel strike.  Stairs            Wheelchair Mobility     Tilt Bed    Modified Rankin (Stroke Patients Only)       Balance Overall balance assessment: Needs assistance Sitting-balance support: No upper extremity supported, Feet supported Sitting balance-Leahy Scale: Good Sitting balance - Comments: Able to maintian static sitting at EOB. No dizzness reported with positional change.   Standing balance support: Bilateral upper extremity supported, During functional activity, Reliant on assistive device for balance Standing balance-Leahy Scale: Fair Standing balance comment: BUE reliance on RW, CGA for static standing balance  Pertinent Vitals/Pain Pain Assessment Pain Assessment: Faces Faces Pain Scale: Hurts little more Pain Location: buttocks Pain Descriptors / Indicators: Discomfort, Grimacing Pain Intervention(s): Monitored during session    Home Living Family/patient expects to be discharged to:: Skilled  nursing facility                   Additional Comments: From [facility]    Prior Function Prior Level of Function : Needs assist             Mobility Comments: Pt reports he has been working on amb at rehab ADLs Comments: Per OT note: Pt reports he was managing on his own before but it was hard and now staff assist him.     Extremity/Trunk Assessment   Upper Extremity Assessment Upper Extremity Assessment: Generalized weakness    Lower Extremity Assessment Lower Extremity Assessment: Generalized weakness    Cervical / Trunk Assessment Cervical / Trunk Assessment: Normal  Communication   Communication Communication: No apparent difficulties    Cognition Arousal: Alert Behavior During Therapy: WFL for tasks assessed/performed, Flat affect   PT - Cognitive impairments: No apparent impairments, No family/caregiver present to determine baseline                       PT - Cognition Comments: Agreeable to PT session Following commands: Impaired Following commands impaired: Follows one step commands with increased time     Cueing Cueing Techniques: Verbal cues, Visual cues     General Comments General comments (skin integrity, edema, etc.): Pillows positioned on L and R sides for pressure relief. Pillow under legs to float heels and between knees.    Exercises     Assessment/Plan    PT Assessment Patient needs continued PT services  PT Problem List Decreased strength;Decreased activity tolerance;Decreased balance;Decreased mobility;Decreased safety awareness;Decreased skin integrity       PT Treatment Interventions Gait training;Functional mobility training;Therapeutic activities;Therapeutic exercise;Patient/family education;Balance training    PT Goals (Current goals can be found in the Care Plan section)  Acute Rehab PT Goals Patient Stated Goal: none stated PT Goal Formulation: With patient Time For Goal Achievement:  02/03/24 Potential to Achieve Goals: Fair    Frequency Min 2X/week     Co-evaluation               AM-PAC PT 6 Clicks Mobility  Outcome Measure Help needed turning from your back to your side while in a flat bed without using bedrails?: A Little Help needed moving from lying on your back to sitting on the side of a flat bed without using bedrails?: A Little Help needed moving to and from a bed to a chair (including a wheelchair)?: A Little Help needed standing up from a chair using your arms (e.g., wheelchair or bedside chair)?: A Little Help needed to walk in hospital room?: A Little Help needed climbing 3-5 steps with a railing? : A Lot 6 Click Score: 17    End of Session Equipment Utilized During Treatment: Gait belt Activity Tolerance: Patient tolerated treatment well Patient left: in bed;with call bell/phone within reach;with bed alarm set   PT Visit Diagnosis: Muscle weakness (generalized) (M62.81);Other abnormalities of gait and mobility (R26.89)    Time: 8382-8369 PT Time Calculation (min) (ACUTE ONLY): 13 min   Charges:                 Chariah Bailey, SPT   Shabazz Mckey 01/20/2024, 3:11 PM

## 2024-01-20 NOTE — Progress Notes (Signed)
 PROGRESS NOTE    Shane [name]  FMW:[id number] DOB: [birth year] DOA: 01/18/2024 PCP: Patient, No Pcp Per    Brief Narrative:  73 y.o. 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  The patient has been having cough productive of clear sputum as well as dyspnea without wheezing.  He denied any fever or chills.  No nausea or vomiting or abdominal pain.  No chest pain or palpitations.  No dysuria, oliguria or hematuria or flank pain.  He denies any headache or dizziness or blurred vision.  No presyncope or syncope.   ED Course: Upon presentation to the ER, temperature was 97.5 and heart rate was 102 with otherwise normal vital signs.  Labs revealed mild hyponatremia 133 better than on 10/25 and hypochloremia of 96 compared to 97 then with albumin 2.7 and total protein 9.5 and otherwise unremarkable CMP.  Lactic acid was 2.5.  CBC showed leukocytosis 26.3 with neutrophilia and significant thrombocytosis of 1403 compared to 1146 and hemoglobin 8.5 hematocrit 27.7 better than previous levels.  Repeat H&H do came back 6.8 and 22.2 with leukocytosis of 24.  PT was 14.8 and INR 1.1.  Respiratory panel came back negative.  UA showed 11-20 WBCs with rare bacteria and small leukocytes.   Assessment & Plan:   Principal Problem:   Sepsis due to pneumonia [facility]) Active Problems:   Symptomatic anemia   Dyslipidemia   Essential hypertension   History of pulmonary embolism   GERD without esophagitis   CAP (community acquired pneumonia)  * Sepsis due to pneumonia (HCC) Sepsis is manifested by tachycardia, tachypnea and leukocytosis.  Presumed source of infection  is right side pneumonia.  MRSA screen neg Plan: Continue Rocephin and azithromycin Mucolytic's Follow cultures Anticipate dc tomorrow   Symptomatic anemia Transfuse 1 unit PRBC.  Hemoglobin responded appropriately.   No indication for the transfusion at this time Recheck CBC in AM   Essential hypertension No BP meds on MAR   Dyslipidemia PTA statin   History of pulmonary embolism PTA Eliquis   GERD without esophagitis PTA PPI      DVT prophylaxis: Eliquis Code Status: Full Family Communication: Daughter via phone 11/3 Disposition Plan: Status is: Inpatient Remains inpatient appropriate because: Pneumonia on IV antibiotics   Level of care: Telemetry  Consultants:  None  Procedures:  None  Antimicrobials: Rocephin Azithromycin   Subjective: Seen and examined.  Resting in bed.  Feels well overall.  Objective: Vitals:   01/20/24 0034 01/20/24 0100 01/20/24 0439 01/20/24 0733  BP: (!) 87/61 97/82 97/61  97/60  Pulse:   93 95  Resp: 18 17 18 18   Temp: 97.9 F (36.6 C)  98 F (36.7 C) 98.2 F (36.8 C)  TempSrc:      SpO2: 99% 100% 100% 98%  Weight:      Height:        Intake/Output Summary (Last 24 hours) at 01/20/2024 1114 Last data filed at 01/20/2024 0900 Gross per 24 hour  Intake 2324.87 ml  Output 480 ml  Net 1844.87 ml   Filed Weights   01/18/24 1619  Weight: 62.1 kg    Examination:  General exam: Appears calm and comfortable  Respiratory system: Mild right sided basilar crackles.  Normal work of breathing.  Room air Cardiovascular system: S1-S2, RRR, no murmurs, no pedal edema Gastrointestinal system: Soft, NT/ND, normal bowel sounds Central nervous system: Alert and oriented. No focal neurological deficits. Extremities: Symmetric 5 x 5 power. Skin: No rashes, lesions or ulcers Psychiatry: Judgement and insight appear normal. Mood & affect appropriate.     Data Reviewed: I have personally reviewed following labs and imaging  studies  CBC: Recent Labs  Lab 01/18/24 1622 01/18/24 1954 01/19/24 0521 01/20/24 0840  WBC 26.3* 24.0* 19.2* 15.6*  NEUTROABS 23.1*  --   --  13.1*  HGB 8.5* 6.8* 7.0* 7.2*  HCT 27.7* 22.2* 21.9* 22.5*  MCV 85.8 86.7 84.9 83.6  PLT 1,403* 1,330* 1,072* 1,267*   Basic Metabolic Panel: Recent Labs  Lab 01/18/24 1622 01/18/24 1954 01/19/24 0521 01/20/24 0840  NA 133*  --  130* 128*  K 4.2  --  4.1 4.1  CL 96*  --  98 93*  CO2 23  --  21* 22  GLUCOSE 111*  --  90 91  BUN 16  --  13 10  CREATININE 0.92 0.57* 0.75 0.74  CALCIUM 9.6  --  8.6* 8.3*   GFR: Estimated Creatinine Clearance: 70.1 mL/min (by C-G formula based on SCr of 0.74 mg/dL). Liver Function Tests: Recent Labs  Lab 01/18/24 1622  AST 31  ALT 11  ALKPHOS 108  BILITOT 0.4  PROT 9.5*  ALBUMIN 2.7*   No results for input(s): LIPASE, AMYLASE in the last 168 hours. No results for input(s): AMMONIA in the last 168 hours. Coagulation Profile: Recent Labs  Lab 01/18/24 1747 01/19/24 0521  INR 1.1 1.2   Cardiac Enzymes: No results for input(s): CKTOTAL, CKMB, CKMBINDEX, TROPONINI in the last 168 hours. BNP (last 3 results) No results for input(s): PROBNP in the last 8760 hours. HbA1C: No results for input(s): HGBA1C in the last 72 hours. CBG: Recent Labs  Lab 01/19/24 2023  GLUCAP 102*   Lipid Profile: No results for input(s): CHOL, HDL, LDLCALC, TRIG, CHOLHDL, LDLDIRECT in the last 72 hours. Thyroid Function Tests: No results for input(s): TSH, T4TOTAL, FREET4, T3FREE, THYROIDAB in the last 72 hours. Anemia Panel: No results for input(s): VITAMINB12, FOLATE, FERRITIN, TIBC, IRON, RETICCTPCT in the last 72 hours. Sepsis Labs: Recent Labs  Lab 01/18/24 1747 01/18/24 1954 01/19/24 0014 01/19/24 0836  LATICACIDVEN 2.5* 3.0* 3.4* 3.0*    Recent Results (from the past 240 hours)  Resp panel by RT-PCR (RSV, Flu A&B, Covid) Anterior Nasal  Swab     Status: None   Collection Time: 01/18/24  5:47 PM   Specimen: Anterior Nasal Swab  Result Value Ref Range Status   SARS Coronavirus 2 by RT PCR NEGATIVE NEGATIVE Final    Comment: (NOTE) SARS-CoV-2 target nucleic acids are NOT DETECTED.  The SARS-CoV-2 RNA is generally detectable in upper respiratory specimens during the acute phase of infection. The lowest concentration of SARS-CoV-2 viral copies this assay can detect is 138 copies/mL. A negative result does not preclude SARS-Cov-2 infection and should not be used as the sole basis for treatment or other patient management decisions. A negative result may occur with  improper specimen collection/handling, submission of specimen other than nasopharyngeal swab, presence of viral mutation(s) within the areas targeted by this assay, and inadequate number of viral copies(<138 copies/mL). A negative result must be combined with clinical observations, patient history, and epidemiological information. The expected result is Negative.  Fact Sheet for Patients:  bloggercourse.com  Fact Sheet for Healthcare Providers:  seriousbroker.it  This test is no t yet approved or cleared by the [place]  FDA and  has been authorized for detection and/or diagnosis of SARS-CoV-2 by FDA under an Emergency Use Authorization (EUA). This EUA will remain  in effect (meaning this test can be used) for the duration of the COVID-19 declaration under Section 564(b)(1) of the Act, 21 U.S.C.section 360bbb-3(b)(1), unless the authorization is terminated  or revoked sooner.       Influenza A by PCR NEGATIVE NEGATIVE Final   Influenza B by PCR NEGATIVE NEGATIVE Final    Comment: (NOTE) The Xpert Xpress SARS-CoV-2/FLU/RSV plus assay is intended as an aid in the diagnosis of influenza from Nasopharyngeal swab specimens and should not be used as a sole basis for treatment. Nasal washings and aspirates  are unacceptable for Xpert Xpress SARS-CoV-2/FLU/RSV testing.  Fact Sheet for Patients: bloggercourse.com  Fact Sheet for Healthcare Providers: seriousbroker.it  This test is not yet approved or cleared by the [place]  FDA and has been authorized for detection and/or diagnosis of SARS-CoV-2 by FDA under an Emergency Use Authorization (EUA). This EUA will remain in effect (meaning this test can be used) for the duration of the COVID-19 declaration under Section 564(b)(1) of the Act, 21 U.S.C. section 360bbb-3(b)(1), unless the authorization is terminated or revoked.     Resp Syncytial Virus by PCR NEGATIVE NEGATIVE Final    Comment: (NOTE) Fact Sheet for Patients: bloggercourse.com  Fact Sheet for Healthcare Providers: seriousbroker.it  This test is not yet approved or cleared by the [place]  FDA and has been authorized for detection and/or diagnosis of SARS-CoV-2 by FDA under an Emergency Use Authorization (EUA). This EUA will remain in effect (meaning this test can be used) for the duration of the COVID-19 declaration under Section 564(b)(1) of the Act, 21 U.S.C. section 360bbb-3(b)(1), unless the authorization is terminated or revoked.  Performed at [facility], [address] [place], [place] [address]   Blood Culture (routine x 2)     Status: None (Preliminary result)   Collection Time: 01/18/24  5:47 PM   Specimen: BLOOD  Result Value Ref Range Status   Specimen Description BLOOD LEFT ANTECUBITAL  Final   Special Requests   Final    BOTTLES DRAWN AEROBIC AND ANAEROBIC Blood Culture adequate volume   Culture   Final    NO GROWTH 2 DAYS Performed at [facility], [address] [place], [place] [address]    Report Status PENDING  Incomplete  Blood Culture (routine x 2)     Status: None (Preliminary result)   Collection Time: 01/18/24   5:47 PM   Specimen: BLOOD  Result Value Ref Range Status   Specimen Description BLOOD BLOOD RIGHT ARM  Final   Special Requests   Final    BOTTLES DRAWN AEROBIC AND ANAEROBIC Blood Culture adequate volume   Culture  Setup Time ANAEROBIC BOTTLE ONLY  Final   Culture   Final    NO GROWTH 2 DAYS Performed at [facility], [address] [place], [place] [address]    Report Status PENDING  Incomplete  MRSA Next Gen by PCR, Nasal     Status: None   Collection Time: 01/19/24  8:35 AM   Specimen: Nasal Mucosa; Nasal Swab  Result Value Ref Range Status   MRSA by PCR Next Gen NOT DETECTED NOT DETECTED Final    Comment: (NOTE) The GeneXpert  MRSA Assay (FDA approved for NASAL specimens only), is one component of a comprehensive MRSA colonization surveillance program. It is not intended to diagnose MRSA infection nor to guide or monitor treatment for MRSA infections. Test performance is not FDA approved in patients less than 60 years old. Performed at [facility], [address], [place], [place] [address]          Radiology Studies: DG Chest 2 View Result Date: 01/18/2024 EXAM: 2 VIEW(S) XRAY OF THE CHEST 01/18/2024 05:58:35 PM COMPARISON: 01/10/2024 CLINICAL HISTORY: elevated WBC count FINDINGS: LUNGS AND PLEURA: Patchy airspace opacity in the right lower lobe. Volume loss on the right. Small right pleural effusion. No pulmonary edema. No pneumothorax. HEART AND MEDIASTINUM: Right port-a-cath tip in the right atrium, unchanged. No acute abnormality of the cardiac and mediastinal silhouettes. BONES AND SOFT TISSUES: No acute osseous abnormality. IMPRESSION: 1. Patchy right lower lobe airspace opacity with associated volume loss, suspicious for pneumonia or atelectasis. 2. Small right pleural effusion. Electronically signed by: Franky Crease MD 01/18/2024 06:10 PM EDT RP Workstation: [id number]        Scheduled Meds:  ascorbic acid  500 mg Oral Daily   enoxaparin  (LOVENOX) injection  40 mg Subcutaneous Q24H   ferrous sulfate  325 mg Oral Q12H   guaiFENesin  600 mg Oral BID   loperamide  2 mg Oral QID   melatonin  5 mg Oral QHS   pantoprazole  40 mg Oral Daily   Continuous Infusions:  azithromycin Stopped (01/19/24 2234)   cefTRIAXone (ROCEPHIN)  IV 2 g (01/20/24 0747)   lactated ringers 75 mL/hr at 01/20/24 0320     LOS: 1 day    Calvin KATHEE Robson, MD [facility]   If 7PM-7AM, please contact night-coverage  01/20/2024, 11:14 AM

## 2024-01-20 NOTE — Evaluation (Signed)
 Occupational Therapy Evaluation Patient Details Name: Shane [name] MRN: [id number] DOB: 1950-06-03 Today's Date: 01/20/2024   History of Present Illness   73 y.o. 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     Clinical Impressions Patient presenting with decreased Ind in self care,balance, functional mobility/transfers,endurance, and safety awareness,and strength. Patient reports recently being at [facility] for rehab but now having PNA and wound. Pt needing mod A for bed mobility. Sit <>stand with min A and use of RW from standard bed height. Pt then begins to cough and spits up phlegm onto floor. Pt able to take several side steps along EOB with RW and min A. Pt returning to bed into side lying position with use of pillows as pt has c/o buttocks pain. Patient will benefit from acute OT to increase overall independence in the areas of ADLs, functional mobility, and safety awareness in order to safely discharge.     If plan is discharge home, recommend the following:   A lot of help with bathing/dressing/bathroom;A little help with walking and/or transfers;Assistance with cooking/housework;Direct supervision/assist for medications management;Direct supervision/assist for financial management;Assist for transportation;Help with stairs or ramp for entrance     Functional Status Assessment   Patient has had a recent decline in their functional status and demonstrates the ability to make significant improvements in function in a reasonable and predictable amount of time.     Equipment Recommendations   Other (comment) (defer to next venue of  care)      Precautions/Restrictions   Precautions Precautions: Fall Recall of Precautions/Restrictions: Impaired     Mobility Bed Mobility Overal bed mobility: Needs Assistance Bed Mobility: Supine to Sit, Sit to Supine     Supine to sit: Mod assist Sit to supine: Mod assist        Transfers Overall transfer level: Needs assistance Equipment used: Rolling walker (2 wheels) Transfers: Sit to/from Stand Sit to Stand: Min assist                  Balance Overall balance assessment: Needs assistance Sitting-balance support: No upper extremity supported, Feet supported Sitting balance-Leahy Scale: Good     Standing balance support: Bilateral upper extremity supported, During functional activity, Reliant on assistive device for balance Standing balance-Leahy Scale: Fair                             ADL either performed or assessed with clinical judgement   ADL Overall ADL's : Needs assistance/impaired                         Toilet Transfer: Minimal assistance;Rolling walker (2 wheels);BSC/3in1 Toilet Transfer Details (indicate cue type and reason): simulated                 Vision Patient Visual Report: No change from baseline              Pertinent Vitals/Pain Pain Assessment Pain Assessment: 0-10 Pain Score: 7  Pain Location: buttocks Pain Descriptors / Indicators: Discomfort, Grimacing Pain Intervention(s): Limited activity within patient's tolerance, Repositioned, Monitored during session  Extremity/Trunk Assessment Upper Extremity Assessment Upper Extremity Assessment: Generalized weakness   Lower Extremity Assessment Lower Extremity Assessment: Generalized weakness   Cervical / Trunk Assessment Cervical / Trunk Assessment: Normal   Communication Communication Communication: No apparent difficulties   Cognition Arousal: Alert Behavior During Therapy: WFL for tasks assessed/performed, Flat  affect Cognition: No apparent impairments                               Following commands: Impaired Following commands impaired: Follows one step commands with increased time     Cueing  General Comments   Cueing Techniques: Verbal cues;Visual cues              Home Living Family/patient expects to be discharged to:: Skilled nursing facility                                 Additional Comments: From [facility]      Prior Functioning/Environment Prior Level of Function : Needs assist               ADLs Comments: Pt reports he was managing on his own before but it was hard and now staff assist him.    OT Problem List: Decreased strength;Decreased activity tolerance;Impaired balance (sitting and/or standing)   OT Treatment/Interventions: Self-care/ADL training;Therapeutic exercise;Therapeutic activities;Energy conservation;Patient/family education;DME and/or AE instruction;Balance training      OT Goals(Current goals can be found in the care plan section)   Acute Rehab OT Goals Patient Stated Goal: to go back to rehab OT Goal Formulation: With patient Time For Goal Achievement: 02/03/24 Potential to Achieve Goals: Good ADL Goals Pt Will Perform Lower Body Dressing: with supervision;sit to/from stand Pt Will Transfer to Toilet: with supervision;ambulating Pt Will Perform Toileting - Clothing Manipulation and hygiene: with supervision;sit to/from stand   OT Frequency:  Min 2X/week       AM-PAC OT 6 Clicks Daily Activity     Outcome Measure Help from another person eating meals?: None Help from another person taking care of personal grooming?: None Help from another person toileting, which includes using toliet, bedpan, or urinal?: A Little Help from another person bathing (including washing, rinsing, drying)?: A Lot Help from another person to put on and taking off regular upper body clothing?: A Little Help from  another person to put on and taking off regular lower body clothing?: A Lot 6 Click Score: 18   End of Session Equipment Utilized During Treatment: Rolling walker (2 wheels) Nurse Communication: Mobility status  Activity Tolerance: Patient tolerated treatment well;Patient limited by fatigue Patient left: in bed;with bed alarm set;with call bell/phone within reach  OT Visit Diagnosis: Other abnormalities of gait and mobility (R26.89);Muscle weakness (generalized) (M62.81)                Time: 8888-8873 OT Time Calculation (min): 15 min Charges:  OT General Charges $OT Visit: 1 Visit OT Evaluation $OT Eval Moderate Complexity: 1 [address] MS, OTR/L , CBIS ascom [phone]  01/20/24, 1:05 PM

## 2024-01-20 NOTE — Plan of Care (Addendum)
  Problem: Clinical Measurements: Goal: Diagnostic test results will improve Outcome: Progressing   Problem: Respiratory: Goal: Ability to maintain adequate ventilation will improve Outcome: Progressing   Problem: Coping: Goal: Level of anxiety will decrease Outcome: Progressing   Problem: Pain Managment: Goal: General experience of comfort will improve and/or be controlled Outcome: Progressing   Problem: Elimination: Goal: Will not experience complications related to bowel motility Outcome: Progressing

## 2024-01-20 NOTE — Consult Note (Addendum)
 WOC Nurse ostomy follow up Refer to WOC previous consult note on 11/2 for sacrum wound and ostomy supplies. Checked in person today, to be sure pt does not need further assistance.   He had ileostomy surgery performed at [facility] on 8/25 and has total assistance with pouch application and emptying prior to admission at a facility.  Bedside nurse states she changed the pouch yesterday.  It is intact with good seal and has mod amt semi formed dark brown stool. This is too thick to use a high output pouch.  Stoma is red and viable when visualized through the pouch. It is approx 1 inch in circumference and has a moderate prolapse.  5 sets of supplies let at the bedside for staff nurses' use: use supplies: 2pc.2 3/4 pouching system skin barrier Gerlean BOX, pouch Gerlean #[id number], barrier ring Gerlean [phone]  Please re-consult if further assistance is needed.  Thank-you,  Stephane Fought MSN, RN, CWOCN, CWCN-AP, CNS Contact Mon-Fri 0700-1500: [phone]

## 2024-01-21 ENCOUNTER — Other Ambulatory Visit: Payer: Self-pay

## 2024-01-21 DIAGNOSIS — J189 Pneumonia, unspecified organism: Secondary | ICD-10-CM | POA: Diagnosis not present

## 2024-01-21 DIAGNOSIS — A419 Sepsis, unspecified organism: Secondary | ICD-10-CM | POA: Diagnosis not present

## 2024-01-21 LAB — RESPIRATORY PANEL BY PCR

## 2024-01-21 LAB — BASIC METABOLIC PANEL WITH GFR
Anion gap: 10 (ref 5–15)
BUN: 9 mg/dL (ref 8–23)
CO2: 24 mmol/L (ref 22–32)
Calcium: 8.3 mg/dL — ABNORMAL LOW (ref 8.9–10.3)
Chloride: 95 mmol/L — ABNORMAL LOW (ref 98–111)
Creatinine, Ser: 0.67 mg/dL (ref 0.61–1.24)
GFR, Estimated: >60 mL/min (ref >60)
Glucose, Bld: 101 mg/dL — ABNORMAL HIGH (ref 70–99)
Potassium: 4.2 mmol/L (ref 3.5–5.1)
Sodium: 129 mmol/L — ABNORMAL LOW (ref 135–145)

## 2024-01-21 LAB — CBC WITH DIFFERENTIAL/PLATELET
Abs Immature Granulocytes: 0.07 K/uL (ref 0.00–0.07)
Basophils Absolute: 0.1 K/uL (ref 0.0–0.1)
Basophils Relative: 0 %
Eosinophils Absolute: 0.1 K/uL (ref 0.0–0.5)
Eosinophils Relative: 1 %
HCT: 20.5 % — ABNORMAL LOW (ref 39.0–52.0)
Hemoglobin: 6.7 g/dL — ABNORMAL LOW (ref 13.0–17.0)
Immature Granulocytes: 1 %
Lymphocytes Relative: 10 %
Lymphs Abs: 1.5 K/uL (ref 0.7–4.0)
MCH: 27.1 pg (ref 26.0–34.0)
MCHC: 32.7 g/dL (ref 30.0–36.0)
MCV: 83 fL (ref 80.0–100.0)
Monocytes Absolute: 0.9 K/uL (ref 0.1–1.0)
Monocytes Relative: 7 %
Neutro Abs: 11.6 K/uL — ABNORMAL HIGH (ref 1.7–7.7)
Neutrophils Relative %: 81 %
Platelets: 1178 K/uL (ref 150–400)
RBC: 2.47 MIL/uL — ABNORMAL LOW (ref 4.22–5.81)
RDW: 18.3 % — ABNORMAL HIGH (ref 11.5–15.5)
WBC: 14.2 K/uL — ABNORMAL HIGH (ref 4.0–10.5)
nRBC: 0 % (ref 0.0–0.2)

## 2024-01-21 LAB — PREPARE RBC (CROSSMATCH)

## 2024-01-21 MED ORDER — AMOXICILLIN-POT CLAVULANATE 875-125 MG PO TABS
1.0000 | ORAL_TABLET | Freq: Two times a day (BID) | ORAL | 0 refills | Status: AC
  Filled 2024-01-21: qty 10, 5d supply, fill #0

## 2024-01-21 MED ORDER — AZITHROMYCIN 500 MG PO TABS
500.0000 mg | ORAL_TABLET | Freq: Every day | ORAL | 0 refills | Status: AC
  Filled 2024-01-21: qty 2, 2d supply, fill #0

## 2024-01-21 MED ORDER — AZITHROMYCIN 500 MG PO TABS: 500.0000 mg | ORAL_TABLET | Freq: Every day | ORAL | Status: DC

## 2024-01-21 MED ORDER — AMOXICILLIN-POT CLAVULANATE 875-125 MG PO TABS
1.0000 | ORAL_TABLET | Freq: Two times a day (BID) | ORAL | Status: DC
  Administered 2024-01-21: 1 via ORAL
  Filled 2024-01-21: qty 1

## 2024-01-21 MED ORDER — SODIUM CHLORIDE 0.9% IV SOLUTION: Freq: Once | INTRAVENOUS | Status: AC

## 2024-01-21 NOTE — Progress Notes (Signed)
 PHARMACIST - PHYSICIAN COMMUNICATION  CONCERNING: Antibiotic IV to Oral Route Change Policy  RECOMMENDATION: This patient is receiving Azithromycin by the intravenous route.  Based on criteria approved by the Pharmacy and Therapeutics Committee, the antibiotic(s) is/are being converted to the equivalent oral dose form(s).   DESCRIPTION: These criteria include: Patient being treated for a respiratory tract infection, urinary tract infection, cellulitis or clostridium difficile associated diarrhea if on metronidazole The patient is not neutropenic and does not exhibit a GI malabsorption state The patient is eating (either orally or via tube) and/or has been taking other orally administered medications for a least 24 hours The patient is improving clinically and has a Tmax < 100.5  If you have questions about this conversion, please contact the Pharmacy Department   Estill [name] Lutes, PharmD, BCPS Clinical Pharmacist 01/21/2024 10:06 AM

## 2024-01-21 NOTE — Plan of Care (Signed)
  Problem: Clinical Measurements: Goal: Diagnostic test results will improve Outcome: Progressing   Problem: Coping: Goal: Level of anxiety will decrease Outcome: Progressing   Problem: Activity: Goal: Risk for activity intolerance will decrease Outcome: Progressing   Problem: Safety: Goal: Ability to remain free from injury will improve Outcome: Progressing   Problem: Pain Managment: Goal: General experience of comfort will improve and/or be controlled Outcome: Progressing   Problem: Skin Integrity: Goal: Risk for impaired skin integrity will decrease Outcome: Progressing

## 2024-01-21 NOTE — Discharge Instructions (Addendum)
 Some PCP options in [place] area- not a comprehensive list  [facility]- [phone] [facility]- [phone] [facility]- [phone] [facility]- [phone] [facility]- [phone] Nichole [phone]  or [facility] Health Physician Referral Line [phone]   [facility] Primary Care Provider List  [facility] at [facility] [address], [place], [place] [address] [phone]  [facility] at [facility] [address] [place], [place] [address] [phone]  [facility] [address] [place], [place], [place] [address] [phone]  [facility] at [facility] [address], [place], [place] [address] [phone]  [facility] Primary Care at [facility] [address], [place], [place] [address] [phone]  [facility] [address] [place], [place], [place] [address] [phone]  [facility] [address], [place], [place] [address] [phone]  [facility] [address] [place], [place] [address] [phone]  [facility] [address] [place], [place] [address] [phone]  [facility] [address], [place], [place] [address] [phone]  [facility] [address] [place], [address], [place], [place] [address] [phone]  [facility] [address] [place], [place] [address] [phone]  [facility] at [facility] [address] [place], [place], [place] [address] [phone]  [facility] at [facility] [address], [place], [place] [address] [phone]  [facility] at [facility] [address] [place], [place] [address] [phone]   At discharge, patient is to report to: [facility] [address] [place], [place] [address] Phone: [phone]

## 2024-01-21 NOTE — TOC Initial Note (Signed)
 Transition of Care [facility]) - Initial/Assessment Note    Patient Details  Name: Shane [name] MRN: [id number] Date of Birth: 11-07-1950  Transition of Care [facility]) CM/SW Contact:    Shane JONETTA Hamilton, RN Phone Number: 01/21/2024, 12:40 PM  Clinical Narrative:                  Met with patient, introduced self and role. Discussed discharge plan for today to go back to [facility] to continue with therapy. Patient verbalized agreement to this plan.  Patient verbalized prior to these recent hospital admissions, he was completely independent. For transportation, patient verbalized he would walk where he needed to go or just catch a ride with someone. When ask how he would be transported from hospital to SNF, patient verbalized he felt like the SNF should transport him back since they are the ones that brought him to the hospital.  Spoke with patients daughter Shane [name]. Shane [name] reports she will be available after work (off at [employer] today) to transport patient from hospital to facility.  Notified provider and bedside RN of the above.   FL2 complete. Shane [name] at [facility] notified of patients pending discharge.   Expected Discharge Plan: Skilled Nursing Facility Barriers to Discharge: Continued Medical Work up, Transportation   Patient Goals and CMS Choice            Expected Discharge Plan and Services   Discharge Planning Services: CM Consult   Living arrangements for the past 2 months: Single Family Home                                      Prior Living Arrangements/Services Living arrangements for the past 2 months: Single Family Home Lives with:: Self Patient language and need for interpreter reviewed:: Yes Do you feel safe going back to the place where you live?: Yes      Need for Family Participation in Patient Care: Yes (Comment) Care giver support system in place?: No (comment) (patient reports independence prior to hospital encounters)   Criminal  Activity/Legal Involvement Pertinent to Current Situation/Hospitalization: No - Comment as needed  Activities of Daily Living   ADL Screening (condition at time of admission) Independently performs ADLs?: No Does the patient have a NEW difficulty with bathing/dressing/toileting/self-feeding that is expected to last >3 days?: No Does the patient have a NEW difficulty with getting in/out of bed, walking, or climbing stairs that is expected to last >3 days?: No Does the patient have a NEW difficulty with communication that is expected to last >3 days?: No Is the patient deaf or have difficulty hearing?: No Does the patient have difficulty seeing, even when wearing glasses/contacts?: No Does the patient have difficulty concentrating, remembering, or making decisions?: No  Permission Sought/Granted Permission sought to share information with : Case Manager, Magazine Features Editor, Family Supports    Share Information with NAME: Shane [name] Childes and [facility] and Facility  Permission granted to share info w AGENCY: Facility  Permission granted to share info w Relationship: daughter  Permission granted to share info w Contact Information: [phone]  Emotional Assessment Appearance:: Appears stated age Attitude/Demeanor/Rapport: Engaged Affect (typically observed): Agitated Orientation: : Oriented to Self, Oriented to Place, Oriented to  Time, Oriented to Situation Alcohol / Substance Use: Not Applicable Psych Involvement: No (comment)  Admission diagnosis:  CAP (community acquired pneumonia) [J18.9] HCAP (healthcare-associated pneumonia) [J18.9] Sepsis due to pneumonia [facility]) [  J18.9, A41.9] Sepsis, due to unspecified organism, unspecified whether acute organ dysfunction present [facility]) [A41.9] Patient Active Problem List   Diagnosis Date Noted   CAP (community acquired pneumonia) 01/19/2024   Sepsis due to pneumonia (HCC) 01/18/2024   Dyslipidemia 01/18/2024   GERD without esophagitis  01/18/2024   Essential hypertension 01/18/2024   History of pulmonary embolism 01/18/2024   Protein-calorie malnutrition, severe 01/06/2024   GI bleeding 01/05/2024   Symptomatic anemia 01/04/2024   UTI (urinary tract infection) 01/04/2024   DVT (deep venous thrombosis)_REU 01/04/2024   Pressure injury of skin 01/04/2024   Protein calorie malnutrition 01/04/2024   Thrombocytosis 01/04/2024   Hyponatremia 01/04/2024   Iron deficiency anemia    Pulmonary embolism (HCC)    GERD (gastroesophageal reflux disease)    Colon cancer (HCC)    Tobacco use disorder    COPD (chronic obstructive pulmonary disease) (HCC)    PCP:  Patient, No Pcp Per Pharmacy:   [id number] [place], [place] - [address] AT [facility] [address] [place] [address] [place] [address] Phone: [phone] Fax: [phone]     Social Drivers of Health (SDOH) Social History: SDOH Screenings   Food Insecurity: Patient Declined (01/19/2024)  Housing: Patient Declined (01/19/2024)  Transportation Needs: Patient Declined (01/19/2024)  Utilities: Patient Declined (01/19/2024)  Financial Resource Strain: Low Risk  (11/04/2023)   Received from [facility]  Physical Activity: Sufficiently Active (04/22/2018)  Social Connections: Unknown (01/19/2024)  Stress: No Stress Concern Present (04/22/2018)  Tobacco Use: High Risk (01/18/2024)   SDOH Interventions:     Readmission Risk Interventions     No data to display

## 2024-01-21 NOTE — Progress Notes (Signed)
 Report called to [facility] spoke with [facility]. Paperwork and medications given to daughter. No apparent distress noted. Daughter will transport patient to facility.

## 2024-01-21 NOTE — [place] FL2 (Signed)
   MEDICAID FL2 LEVEL OF CARE FORM     IDENTIFICATION  Patient Name: Shane [name] Birthdate: 1950-09-19 Sex: male Admission Date (Current Location): 01/18/2024  [facility] and [place] Number:  Chiropodist and Address:  [facility], [address], [place], [place] [address]      Provider Number: [id number]  Attending Physician Name and Address:  Jhonny Calvin NOVAK, MD  Relative Name and Phone Number:  Joao Mccurdy [phone]    Current Level of Care: Hospital Recommended Level of Care: Skilled Nursing Facility Prior Approval Number: [id number]  Date Approved/Denied: 01/20/24 PASRR Number: [id number]  Discharge Plan: SNF    Current Diagnoses: Patient Active Problem List   Diagnosis Date Noted   CAP (community acquired pneumonia) 01/19/2024   Sepsis due to pneumonia (HCC) 01/18/2024   Dyslipidemia 01/18/2024   GERD without esophagitis 01/18/2024   Essential hypertension 01/18/2024   History of pulmonary embolism 01/18/2024   Protein-calorie malnutrition, severe 01/06/2024   GI bleeding 01/05/2024   Symptomatic anemia 01/04/2024   UTI (urinary tract infection) 01/04/2024   DVT (deep venous thrombosis)_REU 01/04/2024   Pressure injury of skin 01/04/2024   Protein calorie malnutrition 01/04/2024   Thrombocytosis 01/04/2024   Hyponatremia 01/04/2024   Iron deficiency anemia    Pulmonary embolism (HCC)    GERD (gastroesophageal reflux disease)    Colon cancer (HCC)    Tobacco use disorder    COPD (chronic obstructive pulmonary disease) (HCC)     Orientation RESPIRATION BLADDER Height & Weight     Self, Time, Situation, Place  Normal Continent Weight: 62.1 kg Height:  6' 5 (195.6 cm)  BEHAVIORAL SYMPTOMS/MOOD NEUROLOGICAL BOWEL NUTRITION STATUS      Colostomy Diet (Regular)  AMBULATORY STATUS COMMUNICATION OF NEEDS Skin   Limited Assist (with rolling walker) Verbally PU Stage and Appropriate Care, Surgical wounds  (If any open areas to abdominal wound noted cleanse with Vashe and apply a piece of silver hydrofiber to area daily, secure with silicone foam or ABD pad and tape whichever is preferred)     PU Stage 3 Dressing: Daily (Cleanse R ischial wound with Vashe, allow to air dry. Apply a piece of silver hydrofiber Soila [phone]) cut to fit wound bed daily and secure with silicone foam) PU Stage 4 Dressing: Daily (Cleanse sacral wound with Vashe, do not rinse and allow to air dry. Apply Vashe moistened Kerlix to wound bed daily, cover with dry gauze, ABD pad and tape or silicone foam whichever is preferred)               Personal Care Assistance Level of Assistance  Bathing, Dressing Bathing Assistance: Maximum assistance   Dressing Assistance: Maximum assistance     Functional Limitations Info  Sight Sight Info: Impaired        SPECIAL CARE FACTORS FREQUENCY  PT (By licensed PT), OT (By licensed OT)     PT Frequency: 5 x OT Frequency: 5 x            Contractures Contractures Info: Not present    Additional Factors Info  Code Status, Allergies Code Status Info: Full Allergies Info: No Known Allergies           Current Medications (01/21/2024):  This is the current hospital active medication list Current Facility-Administered Medications  Medication Dose Route Frequency Provider Last Rate Last Admin   acetaminophen  (TYLENOL ) tablet 650 mg  650 mg Oral Q6H PRN Mansy, Madison LABOR, MD  Or   acetaminophen  (TYLENOL ) suppository 650 mg  650 mg Rectal Q6H PRN Mansy, Madison LABOR, MD       alum & mag hydroxide-simeth (MAALOX/MYLANTA) 200-200-20 MG/5ML suspension 30 mL  30 mL Oral Q4H PRN Mansy, Madison LABOR, MD       amoxicillin-clavulanate (AUGMENTIN) 875-125 MG per tablet 1 tablet  1 tablet Oral Q12H Sreenath, Sudheer B, MD   1 tablet at 01/21/24 1036   ascorbic acid (VITAMIN C) tablet 500 mg  500 mg Oral Daily Mansy, Jan A, MD   500 mg at 01/21/24 0947   azithromycin (ZITHROMAX) tablet 500 mg   500 mg Oral Daily Hallaji, Estill HERO, RPH       chlorpheniramine-HYDROcodone  (TUSSIONEX) 10-8 MG/5ML suspension 5 mL  5 mL Oral Q12H PRN Mansy, Jan A, MD       enoxaparin (LOVENOX) injection 40 mg  40 mg Subcutaneous Q24H Mansy, Jan A, MD   40 mg at 01/20/24 2125   ferrous sulfate tablet 325 mg  325 mg Oral Q12H Mansy, Jan A, MD   325 mg at 01/21/24 0947   guaiFENesin (MUCINEX) 12 hr tablet 600 mg  600 mg Oral BID Mansy, Jan A, MD   600 mg at 01/21/24 0945   ipratropium-albuterol (DUONEB) 0.5-2.5 (3) MG/3ML nebulizer solution 3 mL  3 mL Nebulization Q4H PRN Sreenath, Sudheer B, MD       loperamide (IMODIUM) capsule 2 mg  2 mg Oral QID Mansy, Jan A, MD   2 mg at 01/21/24 0946   magnesium hydroxide (MILK OF MAGNESIA) suspension 30 mL  30 mL Oral Daily PRN Mansy, Jan A, MD       melatonin tablet 5 mg  5 mg Oral QHS Mansy, Jan A, MD   5 mg at 01/20/24 2124   morphine (PF) 2 MG/ML injection 2 mg  2 mg Intravenous Q4H PRN Mansy, Jan A, MD       ondansetron (ZOFRAN) tablet 4 mg  4 mg Oral Q6H PRN Mansy, Jan A, MD       Or   ondansetron (ZOFRAN) injection 4 mg  4 mg Intravenous Q6H PRN Mansy, Jan A, MD       pantoprazole (PROTONIX) EC tablet 40 mg  40 mg Oral Daily Mansy, Jan A, MD   40 mg at 01/21/24 0946   simethicone (MYLICON) chewable tablet 80 mg  80 mg Oral QID PRN Mansy, Jan A, MD       traZODone (DESYREL) tablet 25 mg  25 mg Oral QHS PRN Mansy, Madison LABOR, MD         Discharge Medications: Please see discharge summary for a list of discharge medications.  Relevant Imaging Results:  Relevant Lab Results:   Additional Information SS# [id number]  Daved JONETTA Hamilton, RN

## 2024-01-21 NOTE — [place] FL2 (Signed)
 [place]  MEDICAID FL2 LEVEL OF CARE FORM     IDENTIFICATION  Patient Name: Shane [name] Birthdate: 1950-09-10 Sex: male Admission Date (Current Location): 01/18/2024  [facility] and [place] Number:  Chiropodist and Address:  [facility], [address], [place], [place] [address]      Provider Number: [id number]  Attending Physician Name and Address:  Jhonny Calvin NOVAK, MD  Relative Name and Phone Number:  [name] [phone]    Current Level of Care: Hospital Recommended Level of Care: Skilled Nursing Facility Prior Approval Number: [id number]  Date Approved/Denied: 01/20/24 PASRR Number: [id number]  Discharge Plan: SNF    Current Diagnoses: Patient Active Problem List   Diagnosis Date Noted   CAP (community acquired pneumonia) 01/19/2024   Sepsis due to pneumonia (HCC) 01/18/2024   Dyslipidemia 01/18/2024   GERD without esophagitis 01/18/2024   Essential hypertension 01/18/2024   History of pulmonary embolism 01/18/2024   Protein-calorie malnutrition, severe 01/06/2024   GI bleeding 01/05/2024   Symptomatic anemia 01/04/2024   UTI (urinary tract infection) 01/04/2024   DVT (deep venous thrombosis)_REU 01/04/2024   Pressure injury of skin 01/04/2024   Protein calorie malnutrition 01/04/2024   Thrombocytosis 01/04/2024   Hyponatremia 01/04/2024   Iron deficiency anemia    Pulmonary embolism (HCC)    GERD (gastroesophageal reflux disease)    Colon cancer (HCC)    Tobacco use disorder    COPD (chronic obstructive pulmonary disease) (HCC)     Orientation RESPIRATION BLADDER Height & Weight     Self, Time, Situation, Place  Normal Continent Weight: 62.1 kg Height:  6' 5 (195.6 cm)  BEHAVIORAL SYMPTOMS/MOOD NEUROLOGICAL BOWEL NUTRITION STATUS      Colostomy Diet (Regular)  AMBULATORY STATUS COMMUNICATION OF NEEDS Skin   Limited Assist (with rolling walker) Verbally PU Stage and Appropriate Care, Surgical wounds  (If any open areas to abdominal wound noted cleanse with Vashe and apply a piece of silver hydrofiber to area daily, secure with silicone foam or ABD pad and tape whichever is preferred)     PU Stage 3 Dressing: Daily (Cleanse R ischial wound with Vashe, allow to air dry. Apply a piece of silver hydrofiber Soila [phone]) cut to fit wound bed daily and secure with silicone foam) PU Stage 4 Dressing: Daily (Cleanse sacral wound with Vashe, do not rinse and allow to air dry. Apply Vashe moistened Kerlix to wound bed daily, cover with dry gauze, ABD pad and tape or silicone foam whichever is preferred)               Personal Care Assistance Level of Assistance  Bathing, Dressing Bathing Assistance: Maximum assistance   Dressing Assistance: Maximum assistance     Functional Limitations Info  Sight Sight Info: Impaired        SPECIAL CARE FACTORS FREQUENCY  PT (By licensed PT), OT (By licensed OT)     PT Frequency: 5 x OT Frequency: 5 x            Contractures Contractures Info: Not present    Additional Factors Info  Code Status, Allergies Code Status Info: Full Allergies Info: No Known Allergies           Current Medications (01/21/2024):  This is the current hospital active medication list Current Facility-Administered Medications  Medication Dose Route Frequency Provider Last Rate Last Admin   acetaminophen  (TYLENOL ) tablet 650 mg  650 mg Oral Q6H PRN Mansy, Madison LABOR, MD  Or   acetaminophen  (TYLENOL ) suppository 650 mg  650 mg Rectal Q6H PRN Mansy, Madison LABOR, MD       alum & mag hydroxide-simeth (MAALOX/MYLANTA) 200-200-20 MG/5ML suspension 30 mL  30 mL Oral Q4H PRN Mansy, Madison LABOR, MD       amoxicillin-clavulanate (AUGMENTIN) 875-125 MG per tablet 1 tablet  1 tablet Oral Q12H Sreenath, Sudheer B, MD   1 tablet at 01/21/24 1036   ascorbic acid (VITAMIN C) tablet 500 mg  500 mg Oral Daily Mansy, Jan A, MD   500 mg at 01/21/24 0947   azithromycin (ZITHROMAX) tablet 500 mg   500 mg Oral Daily Hallaji, Estill HERO, RPH       chlorpheniramine-HYDROcodone  (TUSSIONEX) 10-8 MG/5ML suspension 5 mL  5 mL Oral Q12H PRN Mansy, Jan A, MD       enoxaparin (LOVENOX) injection 40 mg  40 mg Subcutaneous Q24H Mansy, Jan A, MD   40 mg at 01/20/24 2125   ferrous sulfate tablet 325 mg  325 mg Oral Q12H Mansy, Jan A, MD   325 mg at 01/21/24 0947   guaiFENesin (MUCINEX) 12 hr tablet 600 mg  600 mg Oral BID Mansy, Jan A, MD   600 mg at 01/21/24 0945   ipratropium-albuterol (DUONEB) 0.5-2.5 (3) MG/3ML nebulizer solution 3 mL  3 mL Nebulization Q4H PRN Sreenath, Sudheer B, MD       loperamide (IMODIUM) capsule 2 mg  2 mg Oral QID Mansy, Jan A, MD   2 mg at 01/21/24 1457   magnesium hydroxide (MILK OF MAGNESIA) suspension 30 mL  30 mL Oral Daily PRN Mansy, Jan A, MD       melatonin tablet 5 mg  5 mg Oral QHS Mansy, Jan A, MD   5 mg at 01/20/24 2124   morphine (PF) 2 MG/ML injection 2 mg  2 mg Intravenous Q4H PRN Mansy, Jan A, MD       ondansetron (ZOFRAN) tablet 4 mg  4 mg Oral Q6H PRN Mansy, Jan A, MD       Or   ondansetron (ZOFRAN) injection 4 mg  4 mg Intravenous Q6H PRN Mansy, Jan A, MD       pantoprazole (PROTONIX) EC tablet 40 mg  40 mg Oral Daily Mansy, Jan A, MD   40 mg at 01/21/24 0946   simethicone (MYLICON) chewable tablet 80 mg  80 mg Oral QID PRN Mansy, Jan A, MD       traZODone (DESYREL) tablet 25 mg  25 mg Oral QHS PRN Mansy, Madison LABOR, MD         Discharge Medications: Please see discharge summary for a list of discharge medications.  Relevant Imaging Results:  Relevant Lab Results:   Additional Information SS# [id number]  Daved JONETTA Hamilton, RN

## 2024-01-21 NOTE — Discharge Summary (Signed)
 Physician Shane [name] Summary  ORION MOLE FMW:[id number] DOB: 01/31/1951 DOA: 01/18/2024  PCP: Patient, No Pcp Per  Admit date: 01/18/2024 Shane [name] date: 01/21/2024  Admitted From: LTC Disposition:  LTC  Recommendations for Outpatient Follow-up:  Follow up with PCP in 1-2 weeks   Home Health:No  Equipment/Devices:None   Shane [name] Condition:Stable CODE STATUS:FULL  Diet recommendation: Reg  Brief/Interim Summary:  73 y.o. 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  The patient has been having cough productive of clear sputum as well as dyspnea without wheezing.  He denied any fever or chills.  No nausea or vomiting or abdominal pain.  No chest pain or palpitations.  No dysuria, oliguria or hematuria or flank pain.  He denies any headache or dizziness or blurred vision.  No presyncope or syncope.   ED Course: Upon presentation to the ER, temperature was 97.5 and heart rate was 102 with otherwise normal vital signs.  Labs revealed mild hyponatremia 133 better than on 10/25 and hypochloremia of 96 compared to 97 then with albumin 2.7 and total protein 9.5 and otherwise unremarkable CMP.  Lactic acid was 2.5.  CBC showed leukocytosis 26.3 with neutrophilia and significant thrombocytosis of 1403 compared to 1146 and hemoglobin 8.5 hematocrit 27.7 better than previous levels.  Repeat H&H do came back 6.8 and 22.2 with leukocytosis of 24.  Shane [name] was 14.8 and INR 1.1.  Respiratory panel came back negative.  UA showed 11-20 WBCs with rare bacteria and small leukocytes.      Shane [name] Diagnoses:  Principal Problem:   Sepsis due to pneumonia  [facility]) Active Problems:   Symptomatic anemia   Dyslipidemia   Essential hypertension   History of pulmonary embolism   GERD without esophagitis   CAP (community acquired pneumonia)  * Sepsis due to pneumonia (HCC) Sepsis is manifested by tachycardia, tachypnea and leukocytosis.  Presumed source of infection is right side pneumonia.  MRSA screen neg Plan: Patient is hemodynamically stable.  Sepsis physiology has resolved.  De-escalate to Augmentin to complete antibiotic course.  Stable for Shane [name] and can return to long-term care    Symptomatic anemia Transfused 2 units PRBC during course of admission.  Recommend follow-up hemoglobin as outpatient within 5 to 7 days.   Essential hypertension No BP meds on MAR   Dyslipidemia PTA statin   History of pulmonary embolism PTA Eliquis   GERD without esophagitis PTA PPI   Shane [name] Instructions  Shane [name] Instructions     Diet - low sodium heart healthy   Complete by: As directed    Shane [name] wound care:   Complete by: As directed    Wound care  Daily      Comments: 1.         Cleanse sacral wound with Vashe, do not rinse and allow to air dry.  Apply Vashe moistened Kerlix to wound bed daily, cover with dry gauze, ABD pad and tape or silicone foam whichever is preferred.  2.         Cleanse R ischial wound with Vashe, allow to air dry. Apply a piece of silver hydrofiber Soila [phone] cut to fit wound bed daily and secure with silicone foam.  3.If any open areas to abdominal wound noted cleanse with Vashe and apply a piece of silver hydrofiber to area daily, secure with silicone foam or ABD pad and tape whichever is preferred  01/19/24 1322   Increase activity slowly   Complete by: As directed       Allergies as of 01/21/2024   No Known Allergies      Medication List     STOP taking these medications    ENERGY BOOSTER PO   levofloxacin 750 MG tablet Commonly known as: LEVAQUIN       TAKE these medications     acetaminophen  325 MG tablet Commonly known as: TYLENOL  Take 650 mg by mouth every 6 (six) hours as needed for mild pain (pain score 1-3).   albuterol 108 (90 Base) MCG/ACT inhaler Commonly known as: VENTOLIN HFA Inhale 2 puffs into the lungs every 4 (four) hours as needed for wheezing.   alum & mag hydroxide-simeth 200-200-20 MG/5ML suspension Commonly known as: MAALOX/MYLANTA Take 30 mLs by mouth every 4 (four) hours as needed for indigestion or heartburn.   amoxicillin-clavulanate 875-125 MG tablet Commonly known as: AUGMENTIN Take 1 tablet by mouth every 12 (twelve) hours for 5 days.   ascorbic acid 500 MG tablet Commonly known as: VITAMIN C Take 500 mg by mouth daily.   azithromycin 500 MG tablet Commonly known as: ZITHROMAX Take 1 tablet (500 mg total) by mouth daily for 2 days.   ferrous sulfate 325 (65 FE) MG tablet Take 325 mg by mouth every 12 (twelve) hours.   loperamide 2 MG capsule Commonly known as: IMODIUM Take 2 mg by mouth 4 (four) times daily.   melatonin 3 MG Tabs tablet Take 3 mg by mouth at bedtime.   pantoprazole 40 MG tablet Commonly known as: PROTONIX Take 40 mg by mouth daily.               Shane [name] Care Instructions  (From admission, onward)           Start     Ordered   01/21/24 0000  Shane [name] wound care:       Comments: Wound care  Daily      Comments: 1.         Cleanse sacral wound with Vashe, do not rinse and allow to air dry.  Apply Vashe moistened Kerlix to wound bed daily, cover with dry gauze, ABD pad and tape or silicone foam whichever is preferred.  2.         Cleanse R ischial wound with Vashe, allow to air dry. Apply a piece of silver hydrofiber Soila [phone]) cut to fit wound bed daily and secure with silicone foam.   3.If any open areas to abdominal wound noted cleanse with Vashe and apply a piece of silver hydrofiber to area daily, secure with silicone foam or ABD pad and tape whichever is preferred  01/19/24  1322   01/21/24 1448            No Known Allergies  Consultations: None   Procedures/Studies: DG Chest 2 View Result Date: 01/18/2024 EXAM: 2 VIEW(S) XRAY OF THE CHEST 01/18/2024 05:58:35 PM COMPARISON: 01/10/2024 CLINICAL HISTORY: elevated WBC count FINDINGS: LUNGS AND PLEURA: Patchy airspace opacity in the right lower lobe. Volume loss on the right. Small right pleural effusion. No pulmonary edema. No pneumothorax. HEART AND MEDIASTINUM: Right port-a-cath tip in the right atrium, unchanged. No acute abnormality of the cardiac and mediastinal silhouettes. BONES AND SOFT TISSUES: No acute  osseous abnormality. IMPRESSION: 1. Patchy right lower lobe airspace opacity with associated volume loss, suspicious for pneumonia or atelectasis. 2. Small right pleural effusion. Electronically signed by: Franky Crease MD 01/18/2024 06:10 PM EDT RP Workstation: [id number]   US  THORACENTESIS ASP PLEURAL SPACE W/IMG GUIDE Result Date: 01/10/2024 INDICATION: 73 year old male with a history of colon cancer metastatic to the liver and lungs and COPD who presented to the ED with weakness and lightheadedness. Imaging significant for large right pleural effusion. Request for diagnostic and therapeutic thoracentesis. EXAM: ULTRASOUND GUIDED DIAGNOSTIC AND THERAPEUTIC, RIGHT-SIDED THORACENTESIS MEDICATIONS: 1% lidocaine, 6 mL. COMPLICATIONS: None immediate. PROCEDURE: An ultrasound guided thoracentesis was thoroughly discussed with the patient and questions answered. The benefits, risks, alternatives and complications were also discussed. The patient understands and wishes to proceed with the procedure. Written consent was obtained. Ultrasound was performed to localize and mark an adequate pocket of fluid in the right chest. The area was then prepped and draped in the normal sterile fashion. 1% Lidocaine was used for local anesthesia. Under ultrasound guidance a 6 Fr Safe-T-Centesis catheter was introduced. Thoracentesis  was performed. The catheter was removed and a dressing applied. FINDINGS: A total of approximately 1.2 L of amber fluid was removed. Samples were sent to the laboratory as requested by the clinical team. IMPRESSION: Successful ultrasound guided right-sided thoracentesis yielding 1.2 L of pleural fluid. Procedure performed by: Sherrilee Bal, PA-C under the supervision of Dr. VEAR Lent Electronically Signed   By: Wilkie Lent M.D.   On: 01/10/2024 16:47   DG Chest Port 1 View Result Date: 01/10/2024 CLINICAL DATA:  142230 Pleural effusion 142230 [id number] S/P thoracentesis [id number] EXAM: PORTABLE CHEST - 1 VIEW COMPARISON:  01/04/2024, 01/08/2024 FINDINGS: Significant decrease in size of the loculated right pleural effusion, now small in volume. Patchy opacities in the right lung base, likely atelectasis. No pneumothorax. Mild cardiomegaly. Right chest port in place terminating in the right atrium. Tortuous aorta with aortic atherosclerosis. No acute fracture or destructive lesions. Multilevel thoracic osteophytosis. IMPRESSION: Significant interval decrease in size of the loculated right pleural effusion, now small in volume. No pneumothorax. Electronically Signed   By: Rogelia Myers M.D.   On: 01/10/2024 12:43   CT CHEST ABDOMEN PELVIS W CONTRAST Result Date: 01/08/2024 CLINICAL DATA:  Metastatic disease evaluation, history of colon cancer * Tracking Code: BO * EXAM: CT CHEST, ABDOMEN, AND PELVIS WITH CONTRAST TECHNIQUE: Multidetector CT imaging of the chest, abdomen and pelvis was performed following the standard protocol during bolus administration of intravenous contrast. RADIATION DOSE REDUCTION: This exam was performed according to the departmental dose-optimization program which includes automated exposure control, adjustment of the mA and/or kV according to patient size and/or use of iterative reconstruction technique. CONTRAST:  75mL OMNIPAQUE IOHEXOL 350 MG/ML SOLN COMPARISON:  None  Available. FINDINGS: CT CHEST FINDINGS Cardiovascular: Right chest port catheter. Normal heart size. Left and right coronary artery calcifications. No pericardial effusion. Mediastinum/Nodes: No enlarged mediastinal, hilar, or axillary lymph nodes. Thyroid gland, trachea, and esophagus demonstrate no significant findings. Lungs/Pleura: Large, loculated appearing right pleural effusion with complete atelectasis of the right lower lobe and a large pleural mass situated over the central diaphragm measuring 7.2 x 4.8 x 3.0 cm (series 2, image 51, series 5, image 50). 0.8 cm nodule in the dependent left lung base (series 4, image 131). Musculoskeletal: No chest wall abnormality. No acute osseous findings. CT ABDOMEN PELVIS FINDINGS Hepatobiliary: Multiple hepatic capsular implants as detailed below. No gallstones, gallbladder wall thickening, or biliary dilatation.  Pancreas: Unremarkable. No pancreatic ductal dilatation or surrounding inflammatory changes. Spleen: Splenosis in the left upper quadrant (series 2, image 58). Adrenals/Urinary Tract: Adrenal glands are unremarkable. Kidneys are normal, without renal calculi, solid lesion, or hydronephrosis. Bladder is unremarkable. Stomach/Bowel: Stomach is within normal limits. Bulky, necrotic mass which encases a large portion of the descending and proximal sigmoid colon measuring 15.1 x 14.1 x 8.2 cm (series 2, image 76, series 5, image 39). Right lower quadrant diverting loop ileostomy no bowel obstruction. Vascular/Lymphatic: Aortic atherosclerosis. Enlarged retroperitoneal lymph nodes measuring up 2 1.9 x 1.3 cm (series 2, image 66). Reproductive: No mass or other abnormality. Other: No abdominal wall hernia or abnormality. Multiple peritoneal and hepatic capsular metastatic implants, largest lesion about the peripheral liver dome measuring 8.4 x 3.9 cm (series 2, image 58). Trace ascites. Musculoskeletal: No acute osseous findings. IMPRESSION: 1. Large, loculated  appearing right pleural effusion with complete atelectasis of the right lower lobe and a large pleural mass situated over the central right diaphragm. 2. There is a 0.8 cm nodule of the dependent left lung base, nonspecific although highly suspicious for a small pulmonary metastasis. 3. Multiple peritoneal and hepatic capsular metastatic implants, largest lesion about the peripheral liver dome measuring 8.4 x 3.9 cm. 4. Enlarged retroperitoneal lymph nodes measuring up to 1.9 x 1.3 cm. 5. Bulky, necrotic mass in the left lower quadrant which encases a large portion of the descending and proximal sigmoid colon measuring 15.1 x 14.1 x 8.2 cm. 6. Right lower quadrant diverting loop ileostomy. No bowel obstruction. 7. Coronary artery disease. Aortic Atherosclerosis (ICD10-I70.0). Electronically Signed   By: Marolyn JONETTA Jaksch M.D.   On: 01/08/2024 17:10   DG Chest Port 1 View Result Date: 01/04/2024 CLINICAL DATA:  Shortness of breath.  History of colon cancer. EXAM: PORTABLE CHEST 1 VIEW COMPARISON:  None Available. FINDINGS: Right chest port tip in the right atrium. Volume loss in the right hemithorax with pleural effusion and opacity in the mid lower lung zone. The heart is grossly normal in size. Aortic atherosclerosis and tortuosity. Left lung is grossly clear. No pneumothorax. The bones are subjectively under mineralized. IMPRESSION: Volume loss in the right hemithorax with pleural effusion and opacity in the mid lower lung zone. This may represent atelectasis, pneumonia, or neoplasm. Recommend correlation with any prior outside imaging. Electronically Signed   By: Andrea Gasman M.D.   On: 01/04/2024 17:26      Subjective: Seen and examined on the day of Shane [name].  Stable no distress.  Appropriate Shane [name] home.  Shane [name] Exam: Vitals:   01/21/24 0820 01/21/24 1418  BP: 107/69 (P) 102/71  Pulse: 96 (P) 98  Resp: 17 (P) 16  Temp: 98.2 F (36.8 C) (P) 98.9 F (37.2 C)  SpO2: 100% (P) 100%    Vitals:   01/20/24 1938 01/21/24 0442 01/21/24 0820 01/21/24 1418  BP: 93/65 97/67 107/69 (P) 102/71  Pulse: 92 95 96 (P) 98  Resp: 18 18 17  (P) 16  Temp: 99.2 F (37.3 C) 98.2 F (36.8 C) 98.2 F (36.8 C) (P) 98.9 F (37.2 C)  TempSrc: Oral Oral  (P) Oral  SpO2: 100% 98% 100% (P) 100%  Weight:      Height:        General: Shane [name] is alert, awake, not in acute distress Cardiovascular: RRR, S1/S2 +, no rubs, no gallops Respiratory: CTA bilaterally, no wheezing, no rhonchi Abdominal: Soft, NT, ND, bowel sounds + Extremities: no edema, no cyanosis    The results  of significant diagnostics from this hospitalization (including imaging, microbiology, ancillary and laboratory) are listed below for reference.     Microbiology: Recent Results (from the past 240 hours)  Resp panel by RT-PCR (RSV, Flu A&B, Covid) Anterior Nasal Swab     Status: None   Collection Time: 01/18/24  5:47 PM   Specimen: Anterior Nasal Swab  Result Value Ref Range Status   SARS Coronavirus 2 by RT PCR NEGATIVE NEGATIVE Final    Comment: (NOTE) SARS-CoV-2 target nucleic acids are NOT DETECTED.  The SARS-CoV-2 RNA is generally detectable in upper respiratory specimens during the acute phase of infection. The lowest concentration of SARS-CoV-2 viral copies this assay can detect is 138 copies/mL. A negative result does not preclude SARS-Cov-2 infection and should not be used as the sole basis for treatment or other patient management decisions. A negative result may occur with  improper specimen collection/handling, submission of specimen other than nasopharyngeal swab, presence of viral mutation(s) within the areas targeted by this assay, and inadequate number of viral copies(<138 copies/mL). A negative result must be combined with clinical observations, patient history, and epidemiological information. The expected result is Negative.  Fact Sheet for Patients:   bloggercourse.com  Fact Sheet for Healthcare Providers:  seriousbroker.it  This test is no t yet approved or cleared by the [place]  FDA and  has been authorized for detection and/or diagnosis of SARS-CoV-2 by FDA under an Emergency Use Authorization (EUA). This EUA will remain  in effect (meaning this test can be used) for the duration of the COVID-19 declaration under Section 564(b)(1) of the Act, 21 U.S.C.section 360bbb-3(b)(1), unless the authorization is terminated  or revoked sooner.       Influenza A by PCR NEGATIVE NEGATIVE Final   Influenza B by PCR NEGATIVE NEGATIVE Final    Comment: (NOTE) The Xpert Xpress SARS-CoV-2/FLU/RSV plus assay is intended as an aid in the diagnosis of influenza from Nasopharyngeal swab specimens and should not be used as a sole basis for treatment. Nasal washings and aspirates are unacceptable for Xpert Xpress SARS-CoV-2/FLU/RSV testing.  Fact Sheet for Patients: bloggercourse.com  Fact Sheet for Healthcare Providers: seriousbroker.it  This test is not yet approved or cleared by the [place]  FDA and has been authorized for detection and/or diagnosis of SARS-CoV-2 by FDA under an Emergency Use Authorization (EUA). This EUA will remain in effect (meaning this test can be used) for the duration of the COVID-19 declaration under Section 564(b)(1) of the Act, 21 U.S.C. section 360bbb-3(b)(1), unless the authorization is terminated or revoked.     Resp Syncytial Virus by PCR NEGATIVE NEGATIVE Final    Comment: (NOTE) Fact Sheet for Patients: bloggercourse.com  Fact Sheet for Healthcare Providers: seriousbroker.it  This test is not yet approved or cleared by the [place]  FDA and has been authorized for detection and/or diagnosis of SARS-CoV-2 by FDA under an Emergency Use  Authorization (EUA). This EUA will remain in effect (meaning this test can be used) for the duration of the COVID-19 declaration under Section 564(b)(1) of the Act, 21 U.S.C. section 360bbb-3(b)(1), unless the authorization is terminated or revoked.  Performed at [facility], [address] [place], [place] [address]   Blood Culture (routine x 2)     Status: None (Preliminary result)   Collection Time: 01/18/24  5:47 PM   Specimen: BLOOD  Result Value Ref Range Status   Specimen Description BLOOD LEFT ANTECUBITAL  Final   Special Requests   Final  BOTTLES DRAWN AEROBIC AND ANAEROBIC Blood Culture adequate volume   Culture   Final    NO GROWTH 3 DAYS Performed at [facility], [address] [place], [place] [address]    Report Status PENDING  Incomplete  Blood Culture (routine x 2)     Status: None (Preliminary result)   Collection Time: 01/18/24  5:47 PM   Specimen: BLOOD  Result Value Ref Range Status   Specimen Description   Final    BLOOD BLOOD RIGHT ARM Performed at [facility], [address] [place], [place] [address]    Special Requests   Final    BOTTLES DRAWN AEROBIC AND ANAEROBIC Blood Culture adequate volume Performed at [facility], [address] [place], [place] [address]    Culture  Setup Time   Final    ANAEROBIC BOTTLE ONLY Performed at [facility], [address], [place], [place] [address]    Culture   Final    NO GROWTH 3 DAYS Performed at [facility], [address], [place], [place] [address]    Report Status PENDING  Incomplete  Respiratory (~20 pathogens) panel by PCR     Status: None   Collection Time: 01/19/24  8:35 AM   Specimen: Nasopharyngeal Swab; Respiratory  Result Value Ref Range Status   Adenovirus NOT DETECTED NOT DETECTED Final   Coronavirus 229E NOT DETECTED NOT DETECTED Final    Comment: (NOTE) The Coronavirus on the Respiratory Panel, DOES NOT test for the novel   Coronavirus (2019 nCoV)    Coronavirus HKU1 NOT DETECTED NOT DETECTED Final   Coronavirus NL63 NOT DETECTED NOT DETECTED Final   Coronavirus OC43 NOT DETECTED NOT DETECTED Final   Metapneumovirus NOT DETECTED NOT DETECTED Final   Rhinovirus / Enterovirus NOT DETECTED NOT DETECTED Final   Influenza A NOT DETECTED NOT DETECTED Final   Influenza B NOT DETECTED NOT DETECTED Final   Parainfluenza Virus 1 NOT DETECTED NOT DETECTED Final   Parainfluenza Virus 2 NOT DETECTED NOT DETECTED Final   Parainfluenza Virus 3 NOT DETECTED NOT DETECTED Final   Parainfluenza Virus 4 NOT DETECTED NOT DETECTED Final   Respiratory Syncytial Virus NOT DETECTED NOT DETECTED Final   Bordetella pertussis NOT DETECTED NOT DETECTED Final   Bordetella Parapertussis NOT DETECTED NOT DETECTED Final   Chlamydophila pneumoniae NOT DETECTED NOT DETECTED Final   Mycoplasma pneumoniae NOT DETECTED NOT DETECTED Final    Comment: Performed at [facility], [address] [place], [place] [address]  MRSA Next Gen by PCR, Nasal     Status: None   Collection Time: 01/19/24  8:35 AM   Specimen: Nasal Mucosa; Nasal Swab  Result Value Ref Range Status   MRSA by PCR Next Gen NOT DETECTED NOT DETECTED Final    Comment: (NOTE) The GeneXpert MRSA Assay (FDA approved for NASAL specimens only), is one component of a comprehensive MRSA colonization surveillance program. It is not intended to diagnose MRSA infection nor to guide or monitor treatment for MRSA infections. Test performance is not FDA approved in patients less than 53 years old. Performed at [facility], [address] [place], [place] [address]      Labs: BNP (last 3 results) No results for input(s): BNP in the last 8760 hours. Basic Metabolic Panel: Recent Labs  Lab 01/18/24 1622 01/18/24 1954 01/19/24 0521 01/20/24 0840 01/21/24 1058  NA 133*  --  130* 128* 129*  K 4.2  --  4.1  4.1 4.2  CL 96*  --  98 93* 95*  CO2 23  --  21* 22  24  GLUCOSE 111*  --  90 91 101*  BUN 16  --  13 10 9   CREATININE 0.92 0.57* 0.75 0.74 0.67  CALCIUM 9.6  --  8.6* 8.3* 8.3*   Liver Function Tests: Recent Labs  Lab 01/18/24 1622  AST 31  ALT 11  ALKPHOS 108  BILITOT 0.4  PROT 9.5*  ALBUMIN 2.7*   No results for input(s): LIPASE, AMYLASE in the last 168 hours. No results for input(s): AMMONIA in the last 168 hours. CBC: Recent Labs  Lab 01/18/24 1622 01/18/24 1954 01/19/24 0521 01/20/24 0840 01/21/24 1058  WBC 26.3* 24.0* 19.2* 15.6* 14.2*  NEUTROABS 23.1*  --   --  13.1* 11.6*  HGB 8.5* 6.8* 7.0* 7.2* 6.7*  HCT 27.7* 22.2* 21.9* 22.5* 20.5*  MCV 85.8 86.7 84.9 83.6 83.0  PLT 1,403* 1,330* 1,072* 1,267* 1,178*   Cardiac Enzymes: No results for input(s): CKTOTAL, CKMB, CKMBINDEX, TROPONINI in the last 168 hours. BNP: Invalid input(s): POCBNP CBG: Recent Labs  Lab 01/19/24 2023  GLUCAP 102*   D-Dimer No results for input(s): DDIMER in the last 72 hours. Hgb A1c No results for input(s): HGBA1C in the last 72 hours. Lipid Profile No results for input(s): CHOL, HDL, LDLCALC, TRIG, CHOLHDL, LDLDIRECT in the last 72 hours. Thyroid function studies No results for input(s): TSH, T4TOTAL, T3FREE, THYROIDAB in the last 72 hours.  Invalid input(s): FREET3 Anemia work up No results for input(s): VITAMINB12, FOLATE, FERRITIN, TIBC, IRON, RETICCTPCT in the last 72 hours. Urinalysis    Component Value Date/Time   COLORURINE YELLOW (A) 01/18/2024 1954   APPEARANCEUR HAZY (A) 01/18/2024 1954   LABSPEC 1.024 01/18/2024 1954   PHURINE 5.0 01/18/2024 1954   GLUCOSEU NEGATIVE 01/18/2024 1954   HGBUR NEGATIVE 01/18/2024 1954   BILIRUBINUR NEGATIVE 01/18/2024 1954   KETONESUR NEGATIVE 01/18/2024 1954   PROTEINUR 30 (A) 01/18/2024 1954   NITRITE NEGATIVE 01/18/2024 1954   LEUKOCYTESUR SMALL (A) 01/18/2024 1954   Sepsis Labs Recent Labs  Lab 01/18/24 1954  01/19/24 0521 01/20/24 0840 01/21/24 1058  WBC 24.0* 19.2* 15.6* 14.2*   Microbiology Recent Results (from the past 240 hours)  Resp panel by RT-PCR (RSV, Flu A&B, Covid) Anterior Nasal Swab     Status: None   Collection Time: 01/18/24  5:47 PM   Specimen: Anterior Nasal Swab  Result Value Ref Range Status   SARS Coronavirus 2 by RT PCR NEGATIVE NEGATIVE Final    Comment: (NOTE) SARS-CoV-2 target nucleic acids are NOT DETECTED.  The SARS-CoV-2 RNA is generally detectable in upper respiratory specimens during the acute phase of infection. The lowest concentration of SARS-CoV-2 viral copies this assay can detect is 138 copies/mL. A negative result does not preclude SARS-Cov-2 infection and should not be used as the sole basis for treatment or other patient management decisions. A negative result may occur with  improper specimen collection/handling, submission of specimen other than nasopharyngeal swab, presence of viral mutation(s) within the areas targeted by this assay, and inadequate number of viral copies(<138 copies/mL). A negative result must be combined with clinical observations, patient history, and epidemiological information. The expected result is Negative.  Fact Sheet for Patients:  bloggercourse.com  Fact Sheet for Healthcare Providers:  seriousbroker.it  This test is no t yet approved or cleared by the [place]  FDA and  has been authorized for detection and/or diagnosis of SARS-CoV-2  by FDA under an Emergency Use Authorization (EUA). This EUA will remain  in effect (meaning this test can be used) for the duration of the COVID-19 declaration under Section 564(b)(1) of the Act, 21 U.S.C.section 360bbb-3(b)(1), unless the authorization is terminated  or revoked sooner.       Influenza A by PCR NEGATIVE NEGATIVE Final   Influenza B by PCR NEGATIVE NEGATIVE Final    Comment: (NOTE) The Xpert Xpress  SARS-CoV-2/FLU/RSV plus assay is intended as an aid in the diagnosis of influenza from Nasopharyngeal swab specimens and should not be used as a sole basis for treatment. Nasal washings and aspirates are unacceptable for Xpert Xpress SARS-CoV-2/FLU/RSV testing.  Fact Sheet for Patients: bloggercourse.com  Fact Sheet for Healthcare Providers: seriousbroker.it  This test is not yet approved or cleared by the [place]  FDA and has been authorized for detection and/or diagnosis of SARS-CoV-2 by FDA under an Emergency Use Authorization (EUA). This EUA will remain in effect (meaning this test can be used) for the duration of the COVID-19 declaration under Section 564(b)(1) of the Act, 21 U.S.C. section 360bbb-3(b)(1), unless the authorization is terminated or revoked.     Resp Syncytial Virus by PCR NEGATIVE NEGATIVE Final    Comment: (NOTE) Fact Sheet for Patients: bloggercourse.com  Fact Sheet for Healthcare Providers: seriousbroker.it  This test is not yet approved or cleared by the [place]  FDA and has been authorized for detection and/or diagnosis of SARS-CoV-2 by FDA under an Emergency Use Authorization (EUA). This EUA will remain in effect (meaning this test can be used) for the duration of the COVID-19 declaration under Section 564(b)(1) of the Act, 21 U.S.C. section 360bbb-3(b)(1), unless the authorization is terminated or revoked.  Performed at [facility], [address] [place], [place] [address]   Blood Culture (routine x 2)     Status: None (Preliminary result)   Collection Time: 01/18/24  5:47 PM   Specimen: BLOOD  Result Value Ref Range Status   Specimen Description BLOOD LEFT ANTECUBITAL  Final   Special Requests   Final    BOTTLES DRAWN AEROBIC AND ANAEROBIC Blood Culture adequate volume   Culture   Final    NO GROWTH 3 DAYS Performed at  [facility], [address], [place], [place] [address]    Report Status PENDING  Incomplete  Blood Culture (routine x 2)     Status: None (Preliminary result)   Collection Time: 01/18/24  5:47 PM   Specimen: BLOOD  Result Value Ref Range Status   Specimen Description   Final    BLOOD BLOOD RIGHT ARM Performed at [facility], [address] [place], [place] [address]    Special Requests   Final    BOTTLES DRAWN AEROBIC AND ANAEROBIC Blood Culture adequate volume Performed at [facility], [address], [place], [place] [address]    Culture  Setup Time   Final    ANAEROBIC BOTTLE ONLY Performed at [facility], [address] [place], [place] [address]    Culture   Final    NO GROWTH 3 DAYS Performed at [facility], [address], [place], [place] [address]    Report Status PENDING  Incomplete  Respiratory (~20 pathogens) panel by PCR     Status: None   Collection Time: 01/19/24  8:35 AM   Specimen: Nasopharyngeal Swab; Respiratory  Result Value Ref Range Status   Adenovirus NOT DETECTED NOT DETECTED Final   Coronavirus 229E  NOT DETECTED NOT DETECTED Final    Comment: (NOTE) The Coronavirus on the Respiratory Panel, DOES NOT test for the novel  Coronavirus (2019 nCoV)    Coronavirus HKU1 NOT DETECTED NOT DETECTED Final   Coronavirus NL63 NOT DETECTED NOT DETECTED Final   Coronavirus OC43 NOT DETECTED NOT DETECTED Final   Metapneumovirus NOT DETECTED NOT DETECTED Final   Rhinovirus / Enterovirus NOT DETECTED NOT DETECTED Final   Influenza A NOT DETECTED NOT DETECTED Final   Influenza B NOT DETECTED NOT DETECTED Final   Parainfluenza Virus 1 NOT DETECTED NOT DETECTED Final   Parainfluenza Virus 2 NOT DETECTED NOT DETECTED Final   Parainfluenza Virus 3 NOT DETECTED NOT DETECTED Final   Parainfluenza Virus 4 NOT DETECTED NOT DETECTED Final   Respiratory Syncytial Virus NOT DETECTED NOT DETECTED Final   Bordetella pertussis  NOT DETECTED NOT DETECTED Final   Bordetella Parapertussis NOT DETECTED NOT DETECTED Final   Chlamydophila pneumoniae NOT DETECTED NOT DETECTED Final   Mycoplasma pneumoniae NOT DETECTED NOT DETECTED Final    Comment: Performed at [facility], [address], [place], [place] [address]  MRSA Next Gen by PCR, Nasal     Status: None   Collection Time: 01/19/24  8:35 AM   Specimen: Nasal Mucosa; Nasal Swab  Result Value Ref Range Status   MRSA by PCR Next Gen NOT DETECTED NOT DETECTED Final    Comment: (NOTE) The GeneXpert MRSA Assay (FDA approved for NASAL specimens only), is one component of a comprehensive MRSA colonization surveillance program. It is not intended to diagnose MRSA infection nor to guide or monitor treatment for MRSA infections. Test performance is not FDA approved in patients less than 49 years old. Performed at [facility], [address], [place], [place] [address]      Time coordinating Shane [name]: 40 minutes  SIGNED:   Calvin KATHEE Robson, MD  [facility] 01/21/2024, 2:48 PM Pager   If 7PM-7AM, please contact night-coverage

## 2024-01-22 LAB — TYPE AND SCREEN
ABO/RH(D): A POS
Antibody Screen: NEGATIVE
Unit division: 0

## 2024-01-22 LAB — BPAM RBC
Blood Product Expiration Date: 202512012359
ISSUE DATE / TIME: 202511041440
Unit Type and Rh: 5100

## 2024-01-23 LAB — CULTURE, BLOOD (ROUTINE X 2)
Culture: NO GROWTH
Special Requests: ADEQUATE

## 2024-01-26 LAB — CULTURE, BLOOD (ROUTINE X 2)

## 2024-01-28 LAB — CULTURE, BLOOD (ROUTINE X 2)
Culture  Setup Time: NO GROWTH
Report Status: NO GROWTH
Special Requests: ADEQUATE

## 2024-02-18 NOTE — Discharge Summary (Signed)
 ------------------------------------------------------------------------------- Attestation signed by Sharlee Ozell PARAS, MD PhD at 02/20/24 1850 I saw and evaluated the patient on 12/4, participating in the key portions of the service. I reviewed the residents note. I agree with the residents findings and plan. I personally spent 35 minutes face-to-face and non-face-to-face in the care of this patient, which includes all pre, intra, and post visit E/M time on the date of service. All documented time was specific to the E/M visit and does not include any pre, intra, post procedure related time. - OZELL PARAS SHARLEE, MD PhD   Admitted for anemia and ultimately transitioned to care oriented less around interventions and more around comfort -- his family acknowledges limitations longer term in the treatment of his numberous comorbid conditions. There is a more complex set of considerations related to funding for his care, I am told -- ultimately he might transition to a hospice status but for now his care is oriented around his comfort and safety with discharge to SNF, without enrollment in hospice. Would plan for hospice referral in time.  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 male who is presenting to [facility] with Symptomatic anemia, in the setting of the following pertinent/contributing co-morbidities: Stage IV colon cancer mets to  lung/liver c/b cecal perforation, COPD, chronic anemia, hypertension, recent PE, stage IV sacral ulcer.  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  Recent admission 11/2023 for acute abdominal pain, found to have intraperitoneal free air, large bowel obstruction, hepatic/lung masses.  Emergent exploratory laparotomy identified cecal perforation with feculent peritonitis, received right hemicolectomy, end ileostomy. Recently started palliative chemotherapy, C1D1 Pembrolizumab 200mg  on 11/05.  Patient has elected to pursue comfort care approach ISO multiple comorbidities as described below. Code status updated while admitted to DNR/DNI.  Patient has been without pain, tolerating regular diet, with clear goal of getting home and spending time with family who live in the area. Discharged with Acetaminophen  650 mg q4 hours, Lorazepam 1 mg q2 hours, Zofran  4 mg as needed.  Discharged back to SNF with ongoing discussion regarding long-term care.  Aspiration pneumonia  UTI Presented to outpatient infusion appointment with weakness, decreased mobility, weight loss of 20 pounds in a month.  Patient with known history stage IV colon cancer with metastasis to liver (s/p ileostomy and partial colectomy) for which she was admitted recently for a month long hospitalization at [facility] after descending colon mass caused cecal perforation.  Recently admitted to [facility] health for acute GI bleed requiring 3 units PRBC while on Eliquis.  Received 2 units PRBCs.  On admission,  afebrile, BP 87/53, WBC 21, ANC 17, lactate 2.1, UA large LE, 24 RBC/58 WBC, hyaline casts. CXR with moderate right pleural effusion.  Completed 5 days antibiotic course with Augmentin  for possible aspiration pneumonia (EOT 11/30)   Hx Acute PE Acute on chronic anemia Hemoglobin noted to be 6.5 at outpatient infusion appointment. Short of breath for  the past  month. Noted to have PE on 11/06/2023 CT and started on heparin , then transition to apixaban.  Repeated hospitalizations for acute on chronic anemia ISO Eliquis use.  Many risk factors for PE including metastatic cancer, surgery, prolonged hospital stay, baseline immobility.  May be candidate for IVC filter. Per nursing home med rec, not taking Eliquis.  Large DVT burden in right leg on PVL studies.  Per GOC conversation, elected to stop Eliquis.   Hyponatremia Patient presented with hypotension, lethargy, FFT with 40lb weight loss over 2 months.  Na 132. On exam, mental status is A+Ox4. Volume status is dry. Likely 2/2 poor po intake.    Thrombocytosis On admission, platelet count above 1000.  Noted to be chronic.    Stage IV sacral ulcer FTT Wheelchair/bedbound. Ongoing wound care management.   Malnutrition Severe protein calorie malnutrition was present, with low albumin and severe muscle and fat loss on exam. Started on protein supplementation with Ensure 3 tid with meals.   Condition at Discharge: fair Discharge Medications:    Your Medication List     STOP taking these medications    ferrous sulfate  325 (65 FE) MG tablet       START taking these medications    LORazepam 1 MG tablet Commonly known as: ATIVAN Take 1 tablet (1 mg total) by mouth every two (2) hours as needed.   ondansetron  4 MG tablet Commonly known as: ZOFRAN  Take 1 tablet to 2 tablets (4-8 mg total) by mouth every eight (8) hours as needed for nausea.   oxyCODONE  5 MG immediate release tablet Commonly known as: ROXICODONE  Take 1 tablet (5 mg total) by mouth every four (4) hours as needed for pain.   polyethylene glycol 17 gram packet Commonly known as: MIRALAX Mix 1 packet (17 grams) in 4 to 8oz of fluid and drink by mouth daily as needed (constipation).       CHANGE how you take these medications    acetaminophen  325 MG tablet Commonly known as: TYLENOL  Take 3 tablets (975 mg total) by mouth  every six (6) hours as needed. Not to exceed 3 grams in 24hr period What changed:  how much to take additional instructions       CONTINUE taking these medications    albuterol  90 mcg/actuation inhaler Commonly known as: PROVENTIL  HFA;VENTOLIN  HFA Inhale 2 puffs every four (4) hours as needed for wheezing.   alum-mag-simeth 200-200-20 mg/5 mL Susp Commonly known as: MAALOX PLUS Take 30 mL by mouth Three (3) times a day as needed.   ascorbic acid  (vitamin C ) 500 MG tablet Commonly known as: VITAMIN C  Take 1 tablet (500 mg total) by mouth daily.   loperamide  2 mg capsule Commonly known as: IMODIUM  Take 2 capsules (4 mg total) by mouth four (4) times a day.   melatonin 3 mg Tab Take 1 tablet (3 mg total) by mouth every evening.   pantoprazole  40 MG tablet Commonly known as: Protonix  Take 1 tablet (40 mg total) by mouth daily before breakfast.        Pending Test Results:      Discharge Instructions:  Activity Instructions     Activity as tolerated        Other Instructions     Call MD for:  difficulty breathing, headache or visual disturbances     Call MD for:  persistent nausea or vomiting     Call MD for:  severe uncontrolled pain     Call MD for:  temperature >38.5 Celsius     Discharge instructions     Why was I admitted?  You are admitted after your blood counts were found to be at low at your infusion appointment.  This is likely due to your colon cancer.  While in the hospital, you received blood products and were treated for pneumonia.  While here, you clarified your goals of spending time with family over more aggressive treatments for your cancer or anemia. You will return to your skilled nursing facility for ongoing conversations regarding your goals for your care including seeing family.   Medications: Your new medication list is found in the After Visit Summary below.  If there are any additions, changes, or medications removed they are noted in  this summary.   If you receive pre-packed Adherence Packaging (Bubble Packs) from your home pharmacy, please show this medication list to your home pharmacist so that they can update any changes that have been made.  For a list of side effects or what to expect with your medications, you may access medlineplus at: [email or web address].  Were there any changes in my advance care planning? A new DNR form was completed during this admission. Show these to your primary care doctor and place them on your fridge.  It is important to discuss healthcare issues with your primary care doctor at an upcoming visit, so that your doctor knows what is important to you and what you would or would not want done if you were to become very ill  Who do I contact if I have a question? If you have specific questions about these instructions, your medications or your hospital stay in the next 48 hours, please call [phone] and ask for the geriatrics doctor on call. Let them know that you were just discharged from the hospital.   For new symptoms or problems, call your primary care provider.   If you believe that this is a life-threatening emergency, call 911 or go to the emergency room.  Thank you for allowing us  to participate in your care.     Follow Up instructions and Outpatient Referrals    Call MD for:  difficulty breathing, headache or visual disturbances     Call MD for:  persistent nausea or vomiting     Call MD for:  severe uncontrolled pain     Call MD for:  temperature >38.5 Celsius     Discharge instructions      Appointments which have been scheduled for you    Mar 25, 2024 11:00 AM (Arrive by 10:45 AM) CT ABDOMEN PELVIS W CONTRAST with [facility] CT RM 2 IMG CT [facility]) [address] [place] [address] [phone]  On appt date: Drink lots of water 24 hrs Bring recent lab work Take meds as usual [employer] service fast streamer of current meds Bring snack if diabetic  Let us   know if pt: Allergic to contrast dyes Diabetic Pregnant or nursing Claustrophobic  (Title:[id number])         I spent greater than 30 minutes in the discharge of this patient.  Artist Ill, MD PGY1 Internal Medicine

## 2024-02-24 LAB — ACID FAST CULTURE WITH REFLEXED SENSITIVITIES (MYCOBACTERIA): Acid Fast Culture: NEGATIVE
